# Patient Record
Sex: Male | Born: 1935
Health system: Southern US, Community
[De-identification: ages and names within clinical notes are randomized; demographics above are authoritative.]

## PROBLEM LIST (undated history)

## (undated) DIAGNOSIS — I10 Essential (primary) hypertension: Secondary | ICD-10-CM

## (undated) DIAGNOSIS — M109 Gout, unspecified: Secondary | ICD-10-CM

## (undated) DIAGNOSIS — C61 Malignant neoplasm of prostate: Secondary | ICD-10-CM

## (undated) DIAGNOSIS — E785 Hyperlipidemia, unspecified: Secondary | ICD-10-CM

## (undated) DIAGNOSIS — E079 Disorder of thyroid, unspecified: Secondary | ICD-10-CM

## (undated) HISTORY — PX: COLONOSCOPY: SHX174

## (undated) HISTORY — PX: ESOPHAGOGASTRODUODENOSCOPY: SHX1529

---

## 2012-09-19 ENCOUNTER — Encounter (HOSPITAL_COMMUNITY): Payer: Self-pay | Admitting: *Deleted

## 2012-09-19 ENCOUNTER — Emergency Department (HOSPITAL_COMMUNITY): Payer: Medicare Other

## 2012-09-19 ENCOUNTER — Emergency Department (HOSPITAL_COMMUNITY)
Admission: EM | Admit: 2012-09-19 | Discharge: 2012-09-20 | Disposition: A | Discharge status: Home / Self Care | Payer: Medicare Other | Attending: Emergency Medicine | Admitting: Emergency Medicine

## 2012-09-19 DIAGNOSIS — J3489 Other specified disorders of nose and nasal sinuses: Secondary | ICD-10-CM | POA: Insufficient documentation

## 2012-09-19 DIAGNOSIS — J029 Acute pharyngitis, unspecified: Secondary | ICD-10-CM | POA: Insufficient documentation

## 2012-09-19 DIAGNOSIS — I10 Essential (primary) hypertension: Secondary | ICD-10-CM | POA: Insufficient documentation

## 2012-09-19 DIAGNOSIS — Z79899 Other long term (current) drug therapy: Secondary | ICD-10-CM | POA: Insufficient documentation

## 2012-09-19 DIAGNOSIS — M1990 Unspecified osteoarthritis, unspecified site: Secondary | ICD-10-CM

## 2012-09-19 DIAGNOSIS — M25579 Pain in unspecified ankle and joints of unspecified foot: Secondary | ICD-10-CM | POA: Insufficient documentation

## 2012-09-19 DIAGNOSIS — M7989 Other specified soft tissue disorders: Secondary | ICD-10-CM | POA: Insufficient documentation

## 2012-09-19 DIAGNOSIS — R059 Cough, unspecified: Secondary | ICD-10-CM | POA: Insufficient documentation

## 2012-09-19 DIAGNOSIS — R05 Cough: Secondary | ICD-10-CM | POA: Insufficient documentation

## 2012-09-19 HISTORY — DX: Essential (primary) hypertension: I10

## 2012-09-19 MED ORDER — HYDROCODONE-ACETAMINOPHEN 5-325 MG PO TABS: 1.0000 | ORAL_TABLET | Freq: Four times a day (QID) | ORAL | Status: AC | PRN

## 2012-09-19 MED ORDER — HYDROCODONE-ACETAMINOPHEN 5-325 MG PO TABS
1.0000 | ORAL_TABLET | Freq: Once | ORAL | Status: AC
  Administered 2012-09-19: 1 via ORAL
  Filled 2012-09-19: qty 1

## 2012-09-19 NOTE — ED Provider Notes (Signed)
History   This chart was scribed for Shelda Jakes, MD by Leone Payor, ED Scribe. This patient was seen in room [facility]18[facility] and the patient's care was started at 1955.   CSN: [id number]  Arrival date & time 09/19/12  1804   First MD Initiated Contact with Patient 09/19/12 1955      Chief Complaint  Patient presents with  . Knee Pain  . Ankle Pain     The history is provided by the patient. No language interpreter was used.   Grant Martinez is a 79 y.o. male who presents to the Emergency Department complaining of new, mild, gradual onset right knee, right ankle, and right foot pain starting 1 week ago. Pt states the pain started in the right heel 1 week ago with no redness or swelling. Pt states knee began to hurt 5 days ago and was swollen but has since improved. He reports that his heel and foot currently hurt, but the foot pain is worsened by standing on it. Pt denies any known injury to the area and states it is painful to bear weight on the right leg. Pt reports taking unspecified OTC medication with some relief.     Pt has h/o HTN.  Pt denies smoking and alcohol use.  PCP Dr. Inocencio Homes in [place].  Past Medical History  Diagnosis Date  . Hypertension     History reviewed. No pertinent past surgical history.  History reviewed. No pertinent family history.  History  Substance Use Topics  . Smoking status: Never Smoker   . Smokeless tobacco: Not on file  . Alcohol Use: No      Review of Systems  Constitutional: Negative for fever and chills.  HENT: Positive for congestion, sore throat and rhinorrhea. Negative for neck pain.   Eyes: Negative for visual disturbance.  Respiratory: Positive for cough. Negative for shortness of breath and wheezing.   Cardiovascular: Negative for chest pain.  Gastrointestinal: Negative for nausea, vomiting, abdominal pain and diarrhea.  Genitourinary: Negative for dysuria.  Musculoskeletal: Positive for joint swelling. Negative for back  pain.  Skin: Negative for rash.  Neurological: Negative for headaches.  Hematological: Does not bruise/bleed easily.  All other systems reviewed and are negative.    Allergies  Review of patient's allergies indicates no known allergies.  Home Medications   Current Outpatient Rx  Name  Route  Sig  Dispense  Refill  . HYDROCHLOROTHIAZIDE 25 MG PO TABS   Oral   Take 25 mg by mouth daily.         Marland Kitchen HYDROCODONE-ACETAMINOPHEN 5-325 MG PO TABS   Oral   Take 1-2 tablets by mouth every 6 (six) hours as needed for pain.   10 tablet   0     BP 135/80  Pulse 90  Temp 98.2 F (36.8 C) (Oral)  Resp 16  Ht 5\' 8"  (1.727 m)  Wt 190 lb (86.183 kg)  BMI 28.89 kg/m2  SpO2 95%  Physical Exam  Nursing note and vitals reviewed. Constitutional: He is oriented to person, place, and time. He appears well-developed and well-nourished. No distress.  HENT:  Head: Normocephalic and atraumatic.  Mouth/Throat: Oropharynx is clear and moist.  Eyes: EOM are normal. No scleral icterus.  Neck: Neck supple. No tracheal deviation present.  Cardiovascular: Normal rate, regular rhythm and normal heart sounds.   Pulmonary/Chest: Effort normal and breath sounds normal. No respiratory distress.  Abdominal: Soft. Bowel sounds are normal. There is no tenderness.  Musculoskeletal: Normal range of  motion.       Increased warmth and redness over right knee cap.  Capillary refill in right foot is less than 2 seconds.  Rt ankle, bottom of foot and toes are warm. Swelling and redness around the heal and ankle.  Dorsalis pedis pulse in right foot is 2+.   Lymphadenopathy:    He has no cervical adenopathy.  Neurological: He is alert and oriented to person, place, and time. No cranial nerve deficit. He exhibits normal muscle tone. Coordination normal.       Pt able to move both sets of fingers and toes.   Skin: Skin is warm and dry.  Psychiatric: He has a normal mood and affect. His behavior is normal.     ED Course  Procedures (including critical care time)  DIAGNOSTIC STUDIES: Oxygen Saturation is 95% on room air, adequate by my interpretation.    COORDINATION OF CARE:  8:41 PM Discussed treatment plan which includes x-ray of knee, ankle, and foot with pt at bedside and pt agreed to plan.  Labs Reviewed - No data to display Dg Ankle Complete Right  09/19/2012  *RADIOLOGY REPORT*  Clinical Data: Right ankle pain, no known injury  RIGHT ANKLE - COMPLETE 3+ VIEW  Comparison: None.  Findings: Ankle mortise intact. Mild soft tissue swelling and ankle. Small plantar calcaneal spur. Achilles insertion calcaneal spur formation with dense calcification within the distal Achilles tendon as well. No acute fracture, dislocation or bone destruction.  IMPRESSION: Calcaneal spurring. No acute osseous abnormalities. Calcific tendonitis of the distal Achilles tendon.   Original Report Authenticated By: Ulyses Southward, M.D.    Dg Knee Complete 4 Views Right  09/19/2012  *RADIOLOGY REPORT*  Clinical Data: Right knee pain and swelling, no known injury  RIGHT KNEE - COMPLETE 4+ VIEW  Comparison: None  Findings: Osseous mineralization normal. Minimal medial compartment joint space narrowing. No acute fracture, dislocation or bone destruction. No knee joint effusion. Question mild soft tissue swelling at medial aspect of right knee.  IMPRESSION: Minimal degenerative changes right knee.   Original Report Authenticated By: Ulyses Southward, M.D.    Dg Foot Complete Right  09/19/2012  *RADIOLOGY REPORT*  Clinical Data: Right foot pain, no known injury  RIGHT FOOT COMPLETE - 3+ VIEW  Comparison: None  Findings: Osseous mineralization normal. Joint spaces preserved. Plantar and Achilles insertion calcaneal spur formation. Calcification within the distal Achilles tendon. No acute fracture, dislocation, or bone destruction.  IMPRESSION: Calcaneal spurring. Calcific tendonitis of the distal Achilles tendon. No acute osseous  abnormalities.   Original Report Authenticated By: Ulyses Southward, M.D.      1. Arthritis       MDM  Symptoms consistent with arthritis may be gouty arthritis. X-rays reveal no bony injuries. Patient is nontoxic no acute distress. Will treat with pain medication.       I personally performed the services described in this documentation, which was scribed in my presence. The recorded information has been reviewed and is accurate.     Shelda Jakes, MD 09/19/12 2256

## 2012-09-19 NOTE — ED Notes (Addendum)
Pt states right knee pain, ankle pain and foot pain. States knee was swollen but is much better than it was. Pt states it hurts to bear weight to leg. No known injury

## 2013-04-07 ENCOUNTER — Other Ambulatory Visit (HOSPITAL_COMMUNITY): Payer: Self-pay | Admitting: Urology

## 2013-04-07 DIAGNOSIS — C61 Malignant neoplasm of prostate: Secondary | ICD-10-CM

## 2013-06-15 ENCOUNTER — Other Ambulatory Visit (HOSPITAL_COMMUNITY): Payer: Medicare Other

## 2013-06-15 ENCOUNTER — Ambulatory Visit (HOSPITAL_COMMUNITY)
Admission: RE | Admit: 2013-06-15 | Discharge: 2013-06-15 | Disposition: A | Discharge status: Home / Self Care | Payer: Medicare Other | Source: Ambulatory Visit | Attending: Urology | Admitting: Urology

## 2013-06-15 DIAGNOSIS — K4020 Bilateral inguinal hernia, without obstruction or gangrene, not specified as recurrent: Secondary | ICD-10-CM | POA: Insufficient documentation

## 2013-06-15 DIAGNOSIS — I10 Essential (primary) hypertension: Secondary | ICD-10-CM | POA: Insufficient documentation

## 2013-06-15 DIAGNOSIS — N400 Enlarged prostate without lower urinary tract symptoms: Secondary | ICD-10-CM | POA: Insufficient documentation

## 2013-06-15 DIAGNOSIS — N501 Vascular disorders of male genital organs: Secondary | ICD-10-CM | POA: Insufficient documentation

## 2013-06-15 DIAGNOSIS — C61 Malignant neoplasm of prostate: Secondary | ICD-10-CM

## 2013-06-15 LAB — CREATININE, SERUM
GFR calc Af Amer: 61 mL/min — ABNORMAL LOW (ref >90)
GFR calc non Af Amer: 52 mL/min — ABNORMAL LOW (ref >90)

## 2013-06-15 MED ORDER — GADOBENATE DIMEGLUMINE 529 MG/ML IV SOLN
18.0000 mL | Freq: Once | INTRAVENOUS | Status: AC | PRN
  Administered 2013-06-15: 18 mL via INTRAVENOUS

## 2013-07-13 ENCOUNTER — Other Ambulatory Visit (HOSPITAL_COMMUNITY): Payer: Self-pay | Admitting: Urology

## 2013-07-13 DIAGNOSIS — C61 Malignant neoplasm of prostate: Secondary | ICD-10-CM

## 2013-07-28 ENCOUNTER — Encounter (HOSPITAL_COMMUNITY): Payer: Medicare Other

## 2014-11-03 DIAGNOSIS — E039 Hypothyroidism, unspecified: Secondary | ICD-10-CM | POA: Diagnosis not present

## 2014-11-03 DIAGNOSIS — E782 Mixed hyperlipidemia: Secondary | ICD-10-CM | POA: Diagnosis not present

## 2014-11-03 DIAGNOSIS — I10 Essential (primary) hypertension: Secondary | ICD-10-CM | POA: Diagnosis not present

## 2014-11-08 DIAGNOSIS — E039 Hypothyroidism, unspecified: Secondary | ICD-10-CM | POA: Diagnosis not present

## 2014-11-08 DIAGNOSIS — I10 Essential (primary) hypertension: Secondary | ICD-10-CM | POA: Diagnosis not present

## 2014-11-08 DIAGNOSIS — Z23 Encounter for immunization: Secondary | ICD-10-CM | POA: Diagnosis not present

## 2014-11-08 DIAGNOSIS — Z0001 Encounter for general adult medical examination with abnormal findings: Secondary | ICD-10-CM | POA: Diagnosis not present

## 2014-11-08 DIAGNOSIS — C61 Malignant neoplasm of prostate: Secondary | ICD-10-CM | POA: Diagnosis not present

## 2014-11-08 DIAGNOSIS — M1000 Idiopathic gout, unspecified site: Secondary | ICD-10-CM | POA: Diagnosis not present

## 2014-11-08 DIAGNOSIS — H6191 Disorder of right external ear, unspecified: Secondary | ICD-10-CM | POA: Diagnosis not present

## 2014-11-08 DIAGNOSIS — E782 Mixed hyperlipidemia: Secondary | ICD-10-CM | POA: Diagnosis not present

## 2014-12-12 DIAGNOSIS — C61 Malignant neoplasm of prostate: Secondary | ICD-10-CM | POA: Diagnosis not present

## 2014-12-21 DIAGNOSIS — K1121 Acute sialoadenitis: Secondary | ICD-10-CM | POA: Diagnosis not present

## 2014-12-23 DIAGNOSIS — K1121 Acute sialoadenitis: Secondary | ICD-10-CM | POA: Diagnosis not present

## 2014-12-27 DIAGNOSIS — R195 Other fecal abnormalities: Secondary | ICD-10-CM | POA: Diagnosis not present

## 2014-12-27 DIAGNOSIS — C61 Malignant neoplasm of prostate: Secondary | ICD-10-CM | POA: Diagnosis not present

## 2015-01-31 DIAGNOSIS — R194 Change in bowel habit: Secondary | ICD-10-CM | POA: Diagnosis not present

## 2015-01-31 DIAGNOSIS — Z86010 Personal history of colonic polyps: Secondary | ICD-10-CM | POA: Diagnosis not present

## 2015-02-10 DIAGNOSIS — Z79899 Other long term (current) drug therapy: Secondary | ICD-10-CM | POA: Diagnosis not present

## 2015-02-10 DIAGNOSIS — K627 Radiation proctitis: Secondary | ICD-10-CM | POA: Diagnosis not present

## 2015-02-10 DIAGNOSIS — D125 Benign neoplasm of sigmoid colon: Secondary | ICD-10-CM | POA: Diagnosis not present

## 2015-02-10 DIAGNOSIS — Z1211 Encounter for screening for malignant neoplasm of colon: Secondary | ICD-10-CM | POA: Diagnosis not present

## 2015-02-10 DIAGNOSIS — R194 Change in bowel habit: Secondary | ICD-10-CM | POA: Diagnosis not present

## 2015-02-10 DIAGNOSIS — Z86010 Personal history of colonic polyps: Secondary | ICD-10-CM | POA: Diagnosis not present

## 2015-02-10 DIAGNOSIS — K5730 Diverticulosis of large intestine without perforation or abscess without bleeding: Secondary | ICD-10-CM | POA: Diagnosis not present

## 2015-02-10 DIAGNOSIS — Z8546 Personal history of malignant neoplasm of prostate: Secondary | ICD-10-CM | POA: Diagnosis not present

## 2015-02-10 DIAGNOSIS — K633 Ulcer of intestine: Secondary | ICD-10-CM | POA: Diagnosis not present

## 2015-02-10 DIAGNOSIS — Z923 Personal history of irradiation: Secondary | ICD-10-CM | POA: Diagnosis not present

## 2015-02-15 DIAGNOSIS — K633 Ulcer of intestine: Secondary | ICD-10-CM | POA: Diagnosis not present

## 2015-04-03 DIAGNOSIS — A084 Viral intestinal infection, unspecified: Secondary | ICD-10-CM | POA: Diagnosis not present

## 2015-05-09 DIAGNOSIS — E039 Hypothyroidism, unspecified: Secondary | ICD-10-CM | POA: Diagnosis not present

## 2015-05-09 DIAGNOSIS — M1000 Idiopathic gout, unspecified site: Secondary | ICD-10-CM | POA: Diagnosis not present

## 2015-05-09 DIAGNOSIS — I10 Essential (primary) hypertension: Secondary | ICD-10-CM | POA: Diagnosis not present

## 2015-05-09 DIAGNOSIS — E782 Mixed hyperlipidemia: Secondary | ICD-10-CM | POA: Diagnosis not present

## 2015-05-16 DIAGNOSIS — M1000 Idiopathic gout, unspecified site: Secondary | ICD-10-CM | POA: Diagnosis not present

## 2015-05-16 DIAGNOSIS — E782 Mixed hyperlipidemia: Secondary | ICD-10-CM | POA: Diagnosis not present

## 2015-05-16 DIAGNOSIS — C61 Malignant neoplasm of prostate: Secondary | ICD-10-CM | POA: Diagnosis not present

## 2015-05-16 DIAGNOSIS — I10 Essential (primary) hypertension: Secondary | ICD-10-CM | POA: Diagnosis not present

## 2015-05-16 DIAGNOSIS — E039 Hypothyroidism, unspecified: Secondary | ICD-10-CM | POA: Diagnosis not present

## 2015-06-21 DIAGNOSIS — Z23 Encounter for immunization: Secondary | ICD-10-CM | POA: Diagnosis not present

## 2015-07-04 DIAGNOSIS — C61 Malignant neoplasm of prostate: Secondary | ICD-10-CM | POA: Diagnosis not present

## 2015-07-11 DIAGNOSIS — C61 Malignant neoplasm of prostate: Secondary | ICD-10-CM | POA: Diagnosis not present

## 2015-11-08 DIAGNOSIS — C61 Malignant neoplasm of prostate: Secondary | ICD-10-CM | POA: Diagnosis not present

## 2015-11-08 DIAGNOSIS — I10 Essential (primary) hypertension: Secondary | ICD-10-CM | POA: Diagnosis not present

## 2015-11-08 DIAGNOSIS — E559 Vitamin D deficiency, unspecified: Secondary | ICD-10-CM | POA: Diagnosis not present

## 2015-11-08 DIAGNOSIS — M1000 Idiopathic gout, unspecified site: Secondary | ICD-10-CM | POA: Diagnosis not present

## 2015-11-08 DIAGNOSIS — E782 Mixed hyperlipidemia: Secondary | ICD-10-CM | POA: Diagnosis not present

## 2015-11-08 DIAGNOSIS — E039 Hypothyroidism, unspecified: Secondary | ICD-10-CM | POA: Diagnosis not present

## 2015-11-15 DIAGNOSIS — Z0001 Encounter for general adult medical examination with abnormal findings: Secondary | ICD-10-CM | POA: Diagnosis not present

## 2015-11-15 DIAGNOSIS — E559 Vitamin D deficiency, unspecified: Secondary | ICD-10-CM | POA: Diagnosis not present

## 2015-11-15 DIAGNOSIS — C61 Malignant neoplasm of prostate: Secondary | ICD-10-CM | POA: Diagnosis not present

## 2015-11-15 DIAGNOSIS — E782 Mixed hyperlipidemia: Secondary | ICD-10-CM | POA: Diagnosis not present

## 2015-11-15 DIAGNOSIS — I10 Essential (primary) hypertension: Secondary | ICD-10-CM | POA: Diagnosis not present

## 2015-11-15 DIAGNOSIS — K635 Polyp of colon: Secondary | ICD-10-CM | POA: Diagnosis not present

## 2015-11-15 DIAGNOSIS — E039 Hypothyroidism, unspecified: Secondary | ICD-10-CM | POA: Diagnosis not present

## 2015-11-15 DIAGNOSIS — K5730 Diverticulosis of large intestine without perforation or abscess without bleeding: Secondary | ICD-10-CM | POA: Diagnosis not present

## 2015-11-15 DIAGNOSIS — M1000 Idiopathic gout, unspecified site: Secondary | ICD-10-CM | POA: Diagnosis not present

## 2015-12-11 DIAGNOSIS — H35031 Hypertensive retinopathy, right eye: Secondary | ICD-10-CM | POA: Diagnosis not present

## 2015-12-11 DIAGNOSIS — H25042 Posterior subcapsular polar age-related cataract, left eye: Secondary | ICD-10-CM | POA: Diagnosis not present

## 2015-12-11 DIAGNOSIS — H2512 Age-related nuclear cataract, left eye: Secondary | ICD-10-CM | POA: Diagnosis not present

## 2015-12-11 DIAGNOSIS — H35033 Hypertensive retinopathy, bilateral: Secondary | ICD-10-CM | POA: Diagnosis not present

## 2015-12-11 DIAGNOSIS — H2513 Age-related nuclear cataract, bilateral: Secondary | ICD-10-CM | POA: Diagnosis not present

## 2015-12-11 DIAGNOSIS — H25013 Cortical age-related cataract, bilateral: Secondary | ICD-10-CM | POA: Diagnosis not present

## 2015-12-11 DIAGNOSIS — H35032 Hypertensive retinopathy, left eye: Secondary | ICD-10-CM | POA: Diagnosis not present

## 2015-12-11 DIAGNOSIS — H25012 Cortical age-related cataract, left eye: Secondary | ICD-10-CM | POA: Diagnosis not present

## 2016-01-01 DIAGNOSIS — C61 Malignant neoplasm of prostate: Secondary | ICD-10-CM | POA: Diagnosis not present

## 2016-01-08 DIAGNOSIS — C61 Malignant neoplasm of prostate: Secondary | ICD-10-CM | POA: Diagnosis not present

## 2016-01-16 DIAGNOSIS — H2512 Age-related nuclear cataract, left eye: Secondary | ICD-10-CM | POA: Diagnosis not present

## 2016-02-05 DIAGNOSIS — H25011 Cortical age-related cataract, right eye: Secondary | ICD-10-CM | POA: Diagnosis not present

## 2016-02-05 DIAGNOSIS — H2511 Age-related nuclear cataract, right eye: Secondary | ICD-10-CM | POA: Diagnosis not present

## 2016-02-13 DIAGNOSIS — H2511 Age-related nuclear cataract, right eye: Secondary | ICD-10-CM | POA: Diagnosis not present

## 2016-05-24 DIAGNOSIS — K5730 Diverticulosis of large intestine without perforation or abscess without bleeding: Secondary | ICD-10-CM | POA: Diagnosis not present

## 2016-05-24 DIAGNOSIS — M1000 Idiopathic gout, unspecified site: Secondary | ICD-10-CM | POA: Diagnosis not present

## 2016-05-24 DIAGNOSIS — C61 Malignant neoplasm of prostate: Secondary | ICD-10-CM | POA: Diagnosis not present

## 2016-05-24 DIAGNOSIS — K635 Polyp of colon: Secondary | ICD-10-CM | POA: Diagnosis not present

## 2016-05-24 DIAGNOSIS — E039 Hypothyroidism, unspecified: Secondary | ICD-10-CM | POA: Diagnosis not present

## 2016-05-24 DIAGNOSIS — I10 Essential (primary) hypertension: Secondary | ICD-10-CM | POA: Diagnosis not present

## 2016-05-24 DIAGNOSIS — E782 Mixed hyperlipidemia: Secondary | ICD-10-CM | POA: Diagnosis not present

## 2016-05-24 DIAGNOSIS — E559 Vitamin D deficiency, unspecified: Secondary | ICD-10-CM | POA: Diagnosis not present

## 2016-05-27 DIAGNOSIS — K5730 Diverticulosis of large intestine without perforation or abscess without bleeding: Secondary | ICD-10-CM | POA: Diagnosis not present

## 2016-05-27 DIAGNOSIS — E039 Hypothyroidism, unspecified: Secondary | ICD-10-CM | POA: Diagnosis not present

## 2016-05-27 DIAGNOSIS — Z6828 Body mass index (BMI) 28.0-28.9, adult: Secondary | ICD-10-CM | POA: Diagnosis not present

## 2016-05-27 DIAGNOSIS — E782 Mixed hyperlipidemia: Secondary | ICD-10-CM | POA: Diagnosis not present

## 2016-05-27 DIAGNOSIS — C61 Malignant neoplasm of prostate: Secondary | ICD-10-CM | POA: Diagnosis not present

## 2016-05-27 DIAGNOSIS — K635 Polyp of colon: Secondary | ICD-10-CM | POA: Diagnosis not present

## 2016-05-27 DIAGNOSIS — I10 Essential (primary) hypertension: Secondary | ICD-10-CM | POA: Diagnosis not present

## 2016-06-25 DIAGNOSIS — C61 Malignant neoplasm of prostate: Secondary | ICD-10-CM | POA: Diagnosis not present

## 2016-07-01 DIAGNOSIS — Z23 Encounter for immunization: Secondary | ICD-10-CM | POA: Diagnosis not present

## 2016-07-02 DIAGNOSIS — C61 Malignant neoplasm of prostate: Secondary | ICD-10-CM | POA: Diagnosis not present

## 2016-11-22 DIAGNOSIS — M1000 Idiopathic gout, unspecified site: Secondary | ICD-10-CM | POA: Diagnosis not present

## 2016-11-22 DIAGNOSIS — E559 Vitamin D deficiency, unspecified: Secondary | ICD-10-CM | POA: Diagnosis not present

## 2016-11-22 DIAGNOSIS — K5730 Diverticulosis of large intestine without perforation or abscess without bleeding: Secondary | ICD-10-CM | POA: Diagnosis not present

## 2016-11-22 DIAGNOSIS — E039 Hypothyroidism, unspecified: Secondary | ICD-10-CM | POA: Diagnosis not present

## 2016-11-22 DIAGNOSIS — I10 Essential (primary) hypertension: Secondary | ICD-10-CM | POA: Diagnosis not present

## 2016-11-22 DIAGNOSIS — K635 Polyp of colon: Secondary | ICD-10-CM | POA: Diagnosis not present

## 2016-11-22 DIAGNOSIS — C61 Malignant neoplasm of prostate: Secondary | ICD-10-CM | POA: Diagnosis not present

## 2016-11-22 DIAGNOSIS — E782 Mixed hyperlipidemia: Secondary | ICD-10-CM | POA: Diagnosis not present

## 2016-11-26 DIAGNOSIS — C61 Malignant neoplasm of prostate: Secondary | ICD-10-CM | POA: Diagnosis not present

## 2016-11-26 DIAGNOSIS — I10 Essential (primary) hypertension: Secondary | ICD-10-CM | POA: Diagnosis not present

## 2016-11-26 DIAGNOSIS — E039 Hypothyroidism, unspecified: Secondary | ICD-10-CM | POA: Diagnosis not present

## 2016-11-26 DIAGNOSIS — E782 Mixed hyperlipidemia: Secondary | ICD-10-CM | POA: Diagnosis not present

## 2016-11-26 DIAGNOSIS — K5730 Diverticulosis of large intestine without perforation or abscess without bleeding: Secondary | ICD-10-CM | POA: Diagnosis not present

## 2016-11-26 DIAGNOSIS — K635 Polyp of colon: Secondary | ICD-10-CM | POA: Diagnosis not present

## 2016-11-26 DIAGNOSIS — Z0001 Encounter for general adult medical examination with abnormal findings: Secondary | ICD-10-CM | POA: Diagnosis not present

## 2017-02-25 DIAGNOSIS — C61 Malignant neoplasm of prostate: Secondary | ICD-10-CM | POA: Diagnosis not present

## 2017-05-26 DIAGNOSIS — E039 Hypothyroidism, unspecified: Secondary | ICD-10-CM | POA: Diagnosis not present

## 2017-05-26 DIAGNOSIS — E559 Vitamin D deficiency, unspecified: Secondary | ICD-10-CM | POA: Diagnosis not present

## 2017-05-26 DIAGNOSIS — N400 Enlarged prostate without lower urinary tract symptoms: Secondary | ICD-10-CM | POA: Diagnosis not present

## 2017-05-26 DIAGNOSIS — E782 Mixed hyperlipidemia: Secondary | ICD-10-CM | POA: Diagnosis not present

## 2017-05-26 DIAGNOSIS — I10 Essential (primary) hypertension: Secondary | ICD-10-CM | POA: Diagnosis not present

## 2017-05-28 DIAGNOSIS — C61 Malignant neoplasm of prostate: Secondary | ICD-10-CM | POA: Diagnosis not present

## 2017-05-28 DIAGNOSIS — E039 Hypothyroidism, unspecified: Secondary | ICD-10-CM | POA: Diagnosis not present

## 2017-05-28 DIAGNOSIS — Z6829 Body mass index (BMI) 29.0-29.9, adult: Secondary | ICD-10-CM | POA: Diagnosis not present

## 2017-05-28 DIAGNOSIS — I10 Essential (primary) hypertension: Secondary | ICD-10-CM | POA: Diagnosis not present

## 2017-05-28 DIAGNOSIS — N400 Enlarged prostate without lower urinary tract symptoms: Secondary | ICD-10-CM | POA: Diagnosis not present

## 2017-05-28 DIAGNOSIS — Z23 Encounter for immunization: Secondary | ICD-10-CM | POA: Diagnosis not present

## 2017-05-28 DIAGNOSIS — K5730 Diverticulosis of large intestine without perforation or abscess without bleeding: Secondary | ICD-10-CM | POA: Diagnosis not present

## 2017-05-28 DIAGNOSIS — E782 Mixed hyperlipidemia: Secondary | ICD-10-CM | POA: Diagnosis not present

## 2017-08-27 DIAGNOSIS — C61 Malignant neoplasm of prostate: Secondary | ICD-10-CM | POA: Diagnosis not present

## 2017-10-06 DIAGNOSIS — Z8546 Personal history of malignant neoplasm of prostate: Secondary | ICD-10-CM | POA: Diagnosis not present

## 2017-12-01 DIAGNOSIS — E559 Vitamin D deficiency, unspecified: Secondary | ICD-10-CM | POA: Diagnosis not present

## 2017-12-01 DIAGNOSIS — E039 Hypothyroidism, unspecified: Secondary | ICD-10-CM | POA: Diagnosis not present

## 2017-12-01 DIAGNOSIS — I10 Essential (primary) hypertension: Secondary | ICD-10-CM | POA: Diagnosis not present

## 2017-12-01 DIAGNOSIS — E782 Mixed hyperlipidemia: Secondary | ICD-10-CM | POA: Diagnosis not present

## 2017-12-01 DIAGNOSIS — R972 Elevated prostate specific antigen [PSA]: Secondary | ICD-10-CM | POA: Diagnosis not present

## 2017-12-05 DIAGNOSIS — C61 Malignant neoplasm of prostate: Secondary | ICD-10-CM | POA: Diagnosis not present

## 2017-12-05 DIAGNOSIS — Z0001 Encounter for general adult medical examination with abnormal findings: Secondary | ICD-10-CM | POA: Diagnosis not present

## 2017-12-05 DIAGNOSIS — I10 Essential (primary) hypertension: Secondary | ICD-10-CM | POA: Diagnosis not present

## 2017-12-05 DIAGNOSIS — E559 Vitamin D deficiency, unspecified: Secondary | ICD-10-CM | POA: Diagnosis not present

## 2017-12-05 DIAGNOSIS — E039 Hypothyroidism, unspecified: Secondary | ICD-10-CM | POA: Diagnosis not present

## 2017-12-05 DIAGNOSIS — E782 Mixed hyperlipidemia: Secondary | ICD-10-CM | POA: Diagnosis not present

## 2017-12-05 DIAGNOSIS — M1000 Idiopathic gout, unspecified site: Secondary | ICD-10-CM | POA: Diagnosis not present

## 2017-12-05 DIAGNOSIS — Z6829 Body mass index (BMI) 29.0-29.9, adult: Secondary | ICD-10-CM | POA: Diagnosis not present

## 2018-01-05 DIAGNOSIS — Z8546 Personal history of malignant neoplasm of prostate: Secondary | ICD-10-CM | POA: Diagnosis not present

## 2018-03-30 DIAGNOSIS — Z8546 Personal history of malignant neoplasm of prostate: Secondary | ICD-10-CM | POA: Diagnosis not present

## 2018-04-06 DIAGNOSIS — Z8546 Personal history of malignant neoplasm of prostate: Secondary | ICD-10-CM | POA: Diagnosis not present

## 2018-04-06 DIAGNOSIS — R3915 Urgency of urination: Secondary | ICD-10-CM | POA: Diagnosis not present

## 2018-06-08 DIAGNOSIS — E782 Mixed hyperlipidemia: Secondary | ICD-10-CM | POA: Diagnosis not present

## 2018-06-08 DIAGNOSIS — I10 Essential (primary) hypertension: Secondary | ICD-10-CM | POA: Diagnosis not present

## 2018-06-08 DIAGNOSIS — E559 Vitamin D deficiency, unspecified: Secondary | ICD-10-CM | POA: Diagnosis not present

## 2018-06-08 DIAGNOSIS — N410 Acute prostatitis: Secondary | ICD-10-CM | POA: Diagnosis not present

## 2018-06-08 DIAGNOSIS — E039 Hypothyroidism, unspecified: Secondary | ICD-10-CM | POA: Diagnosis not present

## 2018-06-09 DIAGNOSIS — C61 Malignant neoplasm of prostate: Secondary | ICD-10-CM | POA: Diagnosis not present

## 2018-06-09 DIAGNOSIS — N400 Enlarged prostate without lower urinary tract symptoms: Secondary | ICD-10-CM | POA: Diagnosis not present

## 2018-06-09 DIAGNOSIS — E039 Hypothyroidism, unspecified: Secondary | ICD-10-CM | POA: Diagnosis not present

## 2018-06-09 DIAGNOSIS — I10 Essential (primary) hypertension: Secondary | ICD-10-CM | POA: Diagnosis not present

## 2018-06-09 DIAGNOSIS — E782 Mixed hyperlipidemia: Secondary | ICD-10-CM | POA: Diagnosis not present

## 2018-06-09 DIAGNOSIS — M1000 Idiopathic gout, unspecified site: Secondary | ICD-10-CM | POA: Diagnosis not present

## 2018-06-09 DIAGNOSIS — Z6828 Body mass index (BMI) 28.0-28.9, adult: Secondary | ICD-10-CM | POA: Diagnosis not present

## 2018-06-09 DIAGNOSIS — N182 Chronic kidney disease, stage 2 (mild): Secondary | ICD-10-CM | POA: Diagnosis not present

## 2018-07-08 DIAGNOSIS — Z23 Encounter for immunization: Secondary | ICD-10-CM | POA: Diagnosis not present

## 2018-09-21 DIAGNOSIS — Z8546 Personal history of malignant neoplasm of prostate: Secondary | ICD-10-CM | POA: Diagnosis not present

## 2018-09-25 DIAGNOSIS — R3915 Urgency of urination: Secondary | ICD-10-CM | POA: Diagnosis not present

## 2018-09-25 DIAGNOSIS — Z8546 Personal history of malignant neoplasm of prostate: Secondary | ICD-10-CM | POA: Diagnosis not present

## 2018-12-14 DIAGNOSIS — E039 Hypothyroidism, unspecified: Secondary | ICD-10-CM | POA: Diagnosis not present

## 2018-12-14 DIAGNOSIS — E782 Mixed hyperlipidemia: Secondary | ICD-10-CM | POA: Diagnosis not present

## 2018-12-14 DIAGNOSIS — I10 Essential (primary) hypertension: Secondary | ICD-10-CM | POA: Diagnosis not present

## 2018-12-14 DIAGNOSIS — N182 Chronic kidney disease, stage 2 (mild): Secondary | ICD-10-CM | POA: Diagnosis not present

## 2018-12-14 DIAGNOSIS — R972 Elevated prostate specific antigen [PSA]: Secondary | ICD-10-CM | POA: Diagnosis not present

## 2018-12-14 DIAGNOSIS — R739 Hyperglycemia, unspecified: Secondary | ICD-10-CM | POA: Diagnosis not present

## 2018-12-17 DIAGNOSIS — N400 Enlarged prostate without lower urinary tract symptoms: Secondary | ICD-10-CM | POA: Diagnosis not present

## 2018-12-17 DIAGNOSIS — M1000 Idiopathic gout, unspecified site: Secondary | ICD-10-CM | POA: Diagnosis not present

## 2018-12-17 DIAGNOSIS — Z6828 Body mass index (BMI) 28.0-28.9, adult: Secondary | ICD-10-CM | POA: Diagnosis not present

## 2018-12-17 DIAGNOSIS — E039 Hypothyroidism, unspecified: Secondary | ICD-10-CM | POA: Diagnosis not present

## 2018-12-17 DIAGNOSIS — N182 Chronic kidney disease, stage 2 (mild): Secondary | ICD-10-CM | POA: Diagnosis not present

## 2018-12-17 DIAGNOSIS — I10 Essential (primary) hypertension: Secondary | ICD-10-CM | POA: Diagnosis not present

## 2018-12-17 DIAGNOSIS — E782 Mixed hyperlipidemia: Secondary | ICD-10-CM | POA: Diagnosis not present

## 2018-12-17 DIAGNOSIS — C61 Malignant neoplasm of prostate: Secondary | ICD-10-CM | POA: Diagnosis not present

## 2018-12-17 DIAGNOSIS — Z0001 Encounter for general adult medical examination with abnormal findings: Secondary | ICD-10-CM | POA: Diagnosis not present

## 2019-03-25 DIAGNOSIS — Z8546 Personal history of malignant neoplasm of prostate: Secondary | ICD-10-CM | POA: Diagnosis not present

## 2019-04-01 DIAGNOSIS — R350 Frequency of micturition: Secondary | ICD-10-CM | POA: Diagnosis not present

## 2019-06-15 DIAGNOSIS — R739 Hyperglycemia, unspecified: Secondary | ICD-10-CM | POA: Diagnosis not present

## 2019-06-15 DIAGNOSIS — I10 Essential (primary) hypertension: Secondary | ICD-10-CM | POA: Diagnosis not present

## 2019-06-15 DIAGNOSIS — N182 Chronic kidney disease, stage 2 (mild): Secondary | ICD-10-CM | POA: Diagnosis not present

## 2019-06-15 DIAGNOSIS — E039 Hypothyroidism, unspecified: Secondary | ICD-10-CM | POA: Diagnosis not present

## 2019-06-15 DIAGNOSIS — E782 Mixed hyperlipidemia: Secondary | ICD-10-CM | POA: Diagnosis not present

## 2019-06-22 DIAGNOSIS — I10 Essential (primary) hypertension: Secondary | ICD-10-CM | POA: Diagnosis not present

## 2019-06-22 DIAGNOSIS — Z0001 Encounter for general adult medical examination with abnormal findings: Secondary | ICD-10-CM | POA: Diagnosis not present

## 2019-06-22 DIAGNOSIS — N182 Chronic kidney disease, stage 2 (mild): Secondary | ICD-10-CM | POA: Diagnosis not present

## 2019-06-22 DIAGNOSIS — R7303 Prediabetes: Secondary | ICD-10-CM | POA: Diagnosis not present

## 2019-06-22 DIAGNOSIS — C61 Malignant neoplasm of prostate: Secondary | ICD-10-CM | POA: Diagnosis not present

## 2019-06-22 DIAGNOSIS — Z23 Encounter for immunization: Secondary | ICD-10-CM | POA: Diagnosis not present

## 2019-06-22 DIAGNOSIS — E039 Hypothyroidism, unspecified: Secondary | ICD-10-CM | POA: Diagnosis not present

## 2019-06-22 DIAGNOSIS — Z6828 Body mass index (BMI) 28.0-28.9, adult: Secondary | ICD-10-CM | POA: Diagnosis not present

## 2019-07-12 DIAGNOSIS — R7303 Prediabetes: Secondary | ICD-10-CM | POA: Diagnosis not present

## 2019-07-12 DIAGNOSIS — Z6827 Body mass index (BMI) 27.0-27.9, adult: Secondary | ICD-10-CM | POA: Diagnosis not present

## 2019-07-12 DIAGNOSIS — E039 Hypothyroidism, unspecified: Secondary | ICD-10-CM | POA: Diagnosis not present

## 2019-07-12 DIAGNOSIS — I10 Essential (primary) hypertension: Secondary | ICD-10-CM | POA: Diagnosis not present

## 2019-07-12 DIAGNOSIS — N182 Chronic kidney disease, stage 2 (mild): Secondary | ICD-10-CM | POA: Diagnosis not present

## 2019-07-12 DIAGNOSIS — E782 Mixed hyperlipidemia: Secondary | ICD-10-CM | POA: Diagnosis not present

## 2019-07-12 DIAGNOSIS — R42 Dizziness and giddiness: Secondary | ICD-10-CM | POA: Diagnosis not present

## 2019-07-16 ENCOUNTER — Other Ambulatory Visit: Payer: Self-pay

## 2019-07-16 ENCOUNTER — Encounter (HOSPITAL_COMMUNITY): Payer: Self-pay

## 2019-07-16 ENCOUNTER — Emergency Department (HOSPITAL_COMMUNITY): Payer: Medicare HMO

## 2019-07-16 ENCOUNTER — Emergency Department (HOSPITAL_COMMUNITY)
Admission: EM | Admit: 2019-07-16 | Discharge: 2019-07-17 | Disposition: A | Discharge status: Home / Self Care | Payer: Medicare HMO | Attending: Emergency Medicine | Admitting: Emergency Medicine

## 2019-07-16 DIAGNOSIS — E782 Mixed hyperlipidemia: Secondary | ICD-10-CM | POA: Diagnosis not present

## 2019-07-16 DIAGNOSIS — Z20822 Contact with and (suspected) exposure to covid-19: Secondary | ICD-10-CM

## 2019-07-16 DIAGNOSIS — E860 Dehydration: Secondary | ICD-10-CM | POA: Insufficient documentation

## 2019-07-16 DIAGNOSIS — I10 Essential (primary) hypertension: Secondary | ICD-10-CM | POA: Diagnosis not present

## 2019-07-16 DIAGNOSIS — R0602 Shortness of breath: Secondary | ICD-10-CM | POA: Diagnosis not present

## 2019-07-16 DIAGNOSIS — R531 Weakness: Secondary | ICD-10-CM | POA: Diagnosis not present

## 2019-07-16 DIAGNOSIS — Z20828 Contact with and (suspected) exposure to other viral communicable diseases: Secondary | ICD-10-CM | POA: Diagnosis not present

## 2019-07-16 DIAGNOSIS — E876 Hypokalemia: Secondary | ICD-10-CM | POA: Insufficient documentation

## 2019-07-16 DIAGNOSIS — N182 Chronic kidney disease, stage 2 (mild): Secondary | ICD-10-CM | POA: Diagnosis not present

## 2019-07-16 DIAGNOSIS — R42 Dizziness and giddiness: Secondary | ICD-10-CM | POA: Diagnosis not present

## 2019-07-16 DIAGNOSIS — R7303 Prediabetes: Secondary | ICD-10-CM | POA: Diagnosis not present

## 2019-07-16 DIAGNOSIS — E039 Hypothyroidism, unspecified: Secondary | ICD-10-CM | POA: Diagnosis not present

## 2019-07-16 HISTORY — DX: Disorder of thyroid, unspecified: E07.9

## 2019-07-16 LAB — BASIC METABOLIC PANEL
Anion gap: 13 (ref 5–15)
BUN: 18 mg/dL (ref 8–23)
CO2: 29 mmol/L (ref 22–32)
Calcium: 8.4 mg/dL — ABNORMAL LOW (ref 8.9–10.3)
Chloride: 93 mmol/L — ABNORMAL LOW (ref 98–111)
Creatinine, Ser: 1.38 mg/dL — ABNORMAL HIGH (ref 0.61–1.24)
GFR calc Af Amer: 54 mL/min — ABNORMAL LOW (ref >60)
GFR calc non Af Amer: 47 mL/min — ABNORMAL LOW (ref >60)
Glucose, Bld: 101 mg/dL — ABNORMAL HIGH (ref 70–99)
Potassium: 3 mmol/L — ABNORMAL LOW (ref 3.5–5.1)
Sodium: 135 mmol/L (ref 135–145)

## 2019-07-16 LAB — CBC
HCT: 45.6 % (ref 39.0–52.0)
Hemoglobin: 15.3 g/dL (ref 13.0–17.0)
MCH: 30.7 pg (ref 26.0–34.0)
MCHC: 33.6 g/dL (ref 30.0–36.0)
MCV: 91.6 fL (ref 80.0–100.0)
Platelets: 163 10*3/uL (ref 150–400)
RBC: 4.98 MIL/uL (ref 4.22–5.81)
RDW: 13.7 % (ref 11.5–15.5)
WBC: 3.7 10*3/uL — ABNORMAL LOW (ref 4.0–10.5)
nRBC: 0 % (ref 0.0–0.2)

## 2019-07-16 LAB — CBG MONITORING, ED: Glucose-Capillary: 93 mg/dL (ref 70–99)

## 2019-07-16 MED ORDER — SODIUM CHLORIDE 0.9 % IV BOLUS
1000.0000 mL | Freq: Once | INTRAVENOUS | Status: AC
  Administered 2019-07-16: 1000 mL via INTRAVENOUS

## 2019-07-16 NOTE — ED Notes (Signed)
Pt states he does not need to urinate at this time, aware of DO, urinal at bedside, call light w/in reach

## 2019-07-16 NOTE — ED Triage Notes (Signed)
Pt presents to ED with complaints of generalized weakness, dizziness, non productive cough x 2 weeks. Pt denies fever. Pt tested for Covid today.

## 2019-07-16 NOTE — ED Provider Notes (Signed)
[facility] EMERGENCY DEPARTMENT Provider Note   CSN: [id number] Arrival date & time: 07/16/19  1724     History   Chief Complaint Chief Complaint  Patient presents with  . Weakness    HPI Grant Martinez is a 85 y.o. male.     HPI Patient presents with his niece who assists with the HPI. They present at the request of the patient's physician due to weakness. Patient states that he is generally well, has a history only of hypertension, and thyroid disease, as well as poor hearing. Patient lives with his brother. Notably, the patient's brother is coronavirus positive, currently hospitalized at our affiliated healthcare facility. For 2 weeks the patient has had weakness, decreased appetite, without vomiting, without diarrhea, without abdominal pain or chest pain. No dyspnea. However, today, after being evaluated by his physician, with possible hypoxia, he was sent here for evaluation.  Covid test sent earlier today.  Past Medical History:  Diagnosis Date  . Hypertension   . Thyroid disease     There are no active problems to display for this patient.   History reviewed. No pertinent surgical history.      Home Medications    Prior to Admission medications   Medication Sig Start Date End Date Taking? Authorizing Provider  hydrochlorothiazide (HYDRODIURIL) 25 MG tablet Take 25 mg by mouth daily.    [provider]    Family History No family history on file.  Social History Social History   Tobacco Use  . Smoking status: Never Smoker  . Smokeless tobacco: Never Used  Substance Use Topics  . Alcohol use: No  . Drug use: Never     Allergies   Patient has no known allergies.   Review of Systems Review of Systems  Constitutional:       Per HPI, otherwise negative  HENT:       Per HPI, otherwise negative  Respiratory:       Per HPI, otherwise negative  Cardiovascular:       Per HPI, otherwise negative  Gastrointestinal: Negative for  vomiting.  Endocrine:       Negative aside from HPI  Genitourinary:       Neg aside from HPI   Musculoskeletal:       Per HPI, otherwise negative  Skin: Negative.   Allergic/Immunologic: Negative for immunocompromised state.  Neurological: Positive for weakness. Negative for syncope.     Physical Exam Updated Vital Signs BP 134/81   Pulse 72   Temp 97.9 F (36.6 C) (Tympanic)   Resp 19   Ht 5\' 8"  (1.727 m)   Wt 88 kg   SpO2 96%   BMI 29.50 kg/m   Physical Exam Vitals signs and nursing note reviewed.  Constitutional:      General: He is not in acute distress.    Appearance: He is well-developed.  HENT:     Head: Normocephalic and atraumatic.  Eyes:     Conjunctiva/sclera: Conjunctivae normal.  Cardiovascular:     Rate and Rhythm: Normal rate and regular rhythm.  Pulmonary:     Effort: Pulmonary effort is normal. No respiratory distress.     Breath sounds: No stridor.  Abdominal:     General: There is no distension.     Tenderness: There is no abdominal tenderness. There is no guarding.  Skin:    General: Skin is warm and dry.  Neurological:     Mental Status: He is alert and oriented to person, place, and  time.      ED Treatments / Results  Labs (all labs ordered are listed, but only abnormal results are displayed) Labs Reviewed  BASIC METABOLIC PANEL - Abnormal; Notable for the following components:      Result Value   Potassium 3.0 (*)    Chloride 93 (*)    Glucose, Bld 101 (*)    Creatinine, Ser 1.38 (*)    Calcium 8.4 (*)    GFR calc non Af Amer 47 (*)    GFR calc Af Amer 54 (*)    All other components within normal limits  CBC - Abnormal; Notable for the following components:   WBC 3.7 (*)    All other components within normal limits  URINALYSIS, ROUTINE W REFLEX MICROSCOPIC  CBG MONITORING, ED    EKG EKG Interpretation  Date/Time:  Friday July 16 2019 18:22:22 EDT Ventricular Rate:  82 PR Interval:  144 QRS Duration: 78 QT  Interval:  364 QTC Calculation: 425 R Axis:   -33 Text Interpretation: Normal sinus rhythm Left axis deviation Low voltage QRS Abnormal ECG Confirmed by Carmin Muskrat [phone]) on 07/16/2019 8:55:09 PM   Radiology Dg Chest Port 1 View  Result Date: 07/16/2019 CLINICAL DATA:  85 year old male with generalized weakness and dizziness. EXAM: PORTABLE CHEST 1 VIEW COMPARISON:  None. FINDINGS: No focal consolidation, pleural effusion, pneumothorax. Background of emphysema. The cardiac silhouette is within normal limits. Atherosclerotic calcification of the aortic arch. No acute osseous pathology. IMPRESSION: No active cardiopulmonary disease. Electronically Signed   By: Anner Crete M.D.   On: 07/16/2019 22:18    Procedures Procedures (including critical care time)  Medications Ordered in ED Medications  sodium chloride 0.9 % bolus 1,000 mL (1,000 mLs Intravenous New Bag/Given 07/16/19 2234)     Initial Impression / Assessment and Plan / ED Course  I have reviewed the triage vital signs and the nursing notes.  Pertinent labs & imaging results that were available during my care of the patient were reviewed by me and considered in my medical decision making (see chart for details).       Initial labs notable for mild hypokalemia, mild dehydration with elevated creatinine.  Patient has received fluid resuscitation. X-ray reassuring, no evidence for pneumonia, and the patient has no hypoxia here.  (Late chart completion)  On reexam the patient was in no distress, awake, alert, stated that he felt better. Initial findings discussed with patient and his niece, all reassuring, with mild electrolyte abnormalities, no x-ray evidence for concerning findings.  Urinalysis pending. Patient's urinalysis returned unremarkable, after signout, and with this, improvement here, no ongoing complaints, there is no evidence for decompensated state.  Patient's Covid test was sent earlier in the day.  Patient appropriate for discharge.  Grant Martinez was evaluated in Emergency Department on 07/18/2019 for the symptoms described in the history of present illness. He was evaluated in the context of the global COVID-19 pandemic, which necessitated consideration that the patient might be at risk for infection with the SARS-CoV-2 virus that causes COVID-19. Institutional protocols and algorithms that pertain to the evaluation of patients at risk for COVID-19 are in a state of rapid change based on information released by regulatory bodies including the CDC and federal and state organizations. These policies and algorithms were followed during the patient's care in the ED.   Final Clinical Impressions(s) / ED Diagnoses   Final diagnoses:  Weakness  Dehydration  Hypokalemia     Carmin Muskrat, MD 07/18/19 1030

## 2019-07-17 LAB — URINALYSIS, ROUTINE W REFLEX MICROSCOPIC
Bilirubin Urine: NEGATIVE
Glucose, UA: NEGATIVE mg/dL
Hgb urine dipstick: NEGATIVE
Ketones, ur: 20 mg/dL — AB
Leukocytes,Ua: NEGATIVE
Nitrite: NEGATIVE
Protein, ur: NEGATIVE mg/dL
Specific Gravity, Urine: 1.018 (ref 1.005–1.030)
pH: 6 (ref 5.0–8.0)

## 2019-07-17 MED ORDER — POTASSIUM CHLORIDE CRYS ER 20 MEQ PO TBCR: 20.0000 meq | EXTENDED_RELEASE_TABLET | Freq: Every day | ORAL | 0 refills | Status: DC

## 2019-07-17 NOTE — ED Provider Notes (Signed)
Patient signed out to me by Dr. Vanita Panda to follow-up on labs.  Blood work is unremarkable.  Urinalysis has returned, no sign of infection.  Patient felt to be mildly dehydrated, was given IV fluids.  He does have a positive COVID-19 contact in his home.  Chest x-ray is clear, lungs are clear to auscultation.  He does not have any findings that would suggest he needs hospitalization at this time.  Caregiver was given warning signs for return to the hospital.  He had outpatient Covid testing performed this morning.   Orpah Greek, MD 07/17/19 [phone]

## 2019-07-18 LAB — NOVEL CORONAVIRUS, NAA: SARS-CoV-2, NAA: DETECTED — AB

## 2019-08-05 DIAGNOSIS — R42 Dizziness and giddiness: Secondary | ICD-10-CM | POA: Diagnosis not present

## 2019-08-05 DIAGNOSIS — I6523 Occlusion and stenosis of bilateral carotid arteries: Secondary | ICD-10-CM | POA: Diagnosis not present

## 2019-08-09 DIAGNOSIS — I517 Cardiomegaly: Secondary | ICD-10-CM | POA: Diagnosis not present

## 2019-08-09 DIAGNOSIS — R55 Syncope and collapse: Secondary | ICD-10-CM | POA: Diagnosis not present

## 2019-08-09 DIAGNOSIS — I371 Nonrheumatic pulmonary valve insufficiency: Secondary | ICD-10-CM | POA: Diagnosis not present

## 2019-08-09 DIAGNOSIS — R42 Dizziness and giddiness: Secondary | ICD-10-CM | POA: Diagnosis not present

## 2019-09-29 DIAGNOSIS — Z8546 Personal history of malignant neoplasm of prostate: Secondary | ICD-10-CM | POA: Diagnosis not present

## 2019-10-04 DIAGNOSIS — C61 Malignant neoplasm of prostate: Secondary | ICD-10-CM | POA: Diagnosis not present

## 2019-10-04 DIAGNOSIS — R9721 Rising PSA following treatment for malignant neoplasm of prostate: Secondary | ICD-10-CM | POA: Diagnosis not present

## 2019-12-20 DIAGNOSIS — I10 Essential (primary) hypertension: Secondary | ICD-10-CM | POA: Diagnosis not present

## 2019-12-20 DIAGNOSIS — N182 Chronic kidney disease, stage 2 (mild): Secondary | ICD-10-CM | POA: Diagnosis not present

## 2019-12-20 DIAGNOSIS — E039 Hypothyroidism, unspecified: Secondary | ICD-10-CM | POA: Diagnosis not present

## 2019-12-20 DIAGNOSIS — C61 Malignant neoplasm of prostate: Secondary | ICD-10-CM | POA: Diagnosis not present

## 2019-12-20 DIAGNOSIS — R739 Hyperglycemia, unspecified: Secondary | ICD-10-CM | POA: Diagnosis not present

## 2019-12-20 DIAGNOSIS — E782 Mixed hyperlipidemia: Secondary | ICD-10-CM | POA: Diagnosis not present

## 2019-12-20 DIAGNOSIS — R7303 Prediabetes: Secondary | ICD-10-CM | POA: Diagnosis not present

## 2019-12-27 DIAGNOSIS — Z8546 Personal history of malignant neoplasm of prostate: Secondary | ICD-10-CM | POA: Diagnosis not present

## 2019-12-28 DIAGNOSIS — E782 Mixed hyperlipidemia: Secondary | ICD-10-CM | POA: Diagnosis not present

## 2019-12-28 DIAGNOSIS — Z6829 Body mass index (BMI) 29.0-29.9, adult: Secondary | ICD-10-CM | POA: Diagnosis not present

## 2019-12-28 DIAGNOSIS — I10 Essential (primary) hypertension: Secondary | ICD-10-CM | POA: Diagnosis not present

## 2019-12-28 DIAGNOSIS — R7303 Prediabetes: Secondary | ICD-10-CM | POA: Diagnosis not present

## 2019-12-28 DIAGNOSIS — E039 Hypothyroidism, unspecified: Secondary | ICD-10-CM | POA: Diagnosis not present

## 2019-12-28 DIAGNOSIS — N182 Chronic kidney disease, stage 2 (mild): Secondary | ICD-10-CM | POA: Diagnosis not present

## 2019-12-28 DIAGNOSIS — R42 Dizziness and giddiness: Secondary | ICD-10-CM | POA: Diagnosis not present

## 2020-01-03 DIAGNOSIS — C61 Malignant neoplasm of prostate: Secondary | ICD-10-CM | POA: Diagnosis not present

## 2020-03-28 DIAGNOSIS — R9721 Rising PSA following treatment for malignant neoplasm of prostate: Secondary | ICD-10-CM | POA: Diagnosis not present

## 2020-04-04 DIAGNOSIS — C61 Malignant neoplasm of prostate: Secondary | ICD-10-CM | POA: Diagnosis not present

## 2020-06-23 DIAGNOSIS — R739 Hyperglycemia, unspecified: Secondary | ICD-10-CM | POA: Diagnosis not present

## 2020-06-23 DIAGNOSIS — I10 Essential (primary) hypertension: Secondary | ICD-10-CM | POA: Diagnosis not present

## 2020-06-23 DIAGNOSIS — E039 Hypothyroidism, unspecified: Secondary | ICD-10-CM | POA: Diagnosis not present

## 2020-06-23 DIAGNOSIS — E559 Vitamin D deficiency, unspecified: Secondary | ICD-10-CM | POA: Diagnosis not present

## 2020-06-23 DIAGNOSIS — E782 Mixed hyperlipidemia: Secondary | ICD-10-CM | POA: Diagnosis not present

## 2020-06-28 DIAGNOSIS — Z6830 Body mass index (BMI) 30.0-30.9, adult: Secondary | ICD-10-CM | POA: Diagnosis not present

## 2020-06-28 DIAGNOSIS — Z0001 Encounter for general adult medical examination with abnormal findings: Secondary | ICD-10-CM | POA: Diagnosis not present

## 2020-06-28 DIAGNOSIS — N182 Chronic kidney disease, stage 2 (mild): Secondary | ICD-10-CM | POA: Diagnosis not present

## 2020-06-28 DIAGNOSIS — R7303 Prediabetes: Secondary | ICD-10-CM | POA: Diagnosis not present

## 2020-06-28 DIAGNOSIS — I10 Essential (primary) hypertension: Secondary | ICD-10-CM | POA: Diagnosis not present

## 2020-06-28 DIAGNOSIS — Z23 Encounter for immunization: Secondary | ICD-10-CM | POA: Diagnosis not present

## 2020-06-28 DIAGNOSIS — C61 Malignant neoplasm of prostate: Secondary | ICD-10-CM | POA: Diagnosis not present

## 2020-06-28 DIAGNOSIS — E782 Mixed hyperlipidemia: Secondary | ICD-10-CM | POA: Diagnosis not present

## 2020-09-26 DIAGNOSIS — Z8546 Personal history of malignant neoplasm of prostate: Secondary | ICD-10-CM | POA: Diagnosis not present

## 2020-10-03 DIAGNOSIS — R350 Frequency of micturition: Secondary | ICD-10-CM | POA: Diagnosis not present

## 2020-10-03 DIAGNOSIS — C61 Malignant neoplasm of prostate: Secondary | ICD-10-CM | POA: Diagnosis not present

## 2020-10-09 ENCOUNTER — Other Ambulatory Visit (HOSPITAL_COMMUNITY): Payer: Self-pay | Admitting: Urology

## 2020-10-09 DIAGNOSIS — R9721 Rising PSA following treatment for malignant neoplasm of prostate: Secondary | ICD-10-CM

## 2020-10-19 ENCOUNTER — Ambulatory Visit (HOSPITAL_COMMUNITY)
Admission: RE | Admit: 2020-10-19 | Discharge: 2020-10-19 | Disposition: A | Discharge status: Home / Self Care | Payer: Medicare HMO | Source: Ambulatory Visit | Attending: Urology | Admitting: Urology

## 2020-10-19 ENCOUNTER — Other Ambulatory Visit: Payer: Self-pay

## 2020-10-19 DIAGNOSIS — C61 Malignant neoplasm of prostate: Secondary | ICD-10-CM | POA: Diagnosis not present

## 2020-10-19 DIAGNOSIS — R9721 Rising PSA following treatment for malignant neoplasm of prostate: Secondary | ICD-10-CM | POA: Insufficient documentation

## 2020-10-19 MED ORDER — PIFLIFOLASTAT F 18 (PYLARIFY) INJECTION
9.0000 | Freq: Once | INTRAVENOUS | Status: AC
  Administered 2020-10-19: 9.7 via INTRAVENOUS

## 2020-12-05 DIAGNOSIS — R9721 Rising PSA following treatment for malignant neoplasm of prostate: Secondary | ICD-10-CM | POA: Diagnosis not present

## 2020-12-12 DIAGNOSIS — R9721 Rising PSA following treatment for malignant neoplasm of prostate: Secondary | ICD-10-CM | POA: Diagnosis not present

## 2020-12-12 DIAGNOSIS — C61 Malignant neoplasm of prostate: Secondary | ICD-10-CM | POA: Diagnosis not present

## 2020-12-12 DIAGNOSIS — R350 Frequency of micturition: Secondary | ICD-10-CM | POA: Diagnosis not present

## 2020-12-27 DIAGNOSIS — I10 Essential (primary) hypertension: Secondary | ICD-10-CM | POA: Diagnosis not present

## 2020-12-27 DIAGNOSIS — E7849 Other hyperlipidemia: Secondary | ICD-10-CM | POA: Diagnosis not present

## 2020-12-27 DIAGNOSIS — E039 Hypothyroidism, unspecified: Secondary | ICD-10-CM | POA: Diagnosis not present

## 2020-12-27 DIAGNOSIS — R739 Hyperglycemia, unspecified: Secondary | ICD-10-CM | POA: Diagnosis not present

## 2020-12-27 DIAGNOSIS — N182 Chronic kidney disease, stage 2 (mild): Secondary | ICD-10-CM | POA: Diagnosis not present

## 2020-12-27 DIAGNOSIS — E559 Vitamin D deficiency, unspecified: Secondary | ICD-10-CM | POA: Diagnosis not present

## 2020-12-27 DIAGNOSIS — E782 Mixed hyperlipidemia: Secondary | ICD-10-CM | POA: Diagnosis not present

## 2021-01-01 DIAGNOSIS — C61 Malignant neoplasm of prostate: Secondary | ICD-10-CM | POA: Diagnosis not present

## 2021-01-01 DIAGNOSIS — I10 Essential (primary) hypertension: Secondary | ICD-10-CM | POA: Diagnosis not present

## 2021-01-01 DIAGNOSIS — R7303 Prediabetes: Secondary | ICD-10-CM | POA: Diagnosis not present

## 2021-01-01 DIAGNOSIS — E039 Hypothyroidism, unspecified: Secondary | ICD-10-CM | POA: Diagnosis not present

## 2021-01-01 DIAGNOSIS — E7849 Other hyperlipidemia: Secondary | ICD-10-CM | POA: Diagnosis not present

## 2021-01-01 DIAGNOSIS — E559 Vitamin D deficiency, unspecified: Secondary | ICD-10-CM | POA: Diagnosis not present

## 2021-01-01 DIAGNOSIS — N182 Chronic kidney disease, stage 2 (mild): Secondary | ICD-10-CM | POA: Diagnosis not present

## 2021-01-01 DIAGNOSIS — K4090 Unilateral inguinal hernia, without obstruction or gangrene, not specified as recurrent: Secondary | ICD-10-CM | POA: Diagnosis not present

## 2021-01-15 DIAGNOSIS — K4020 Bilateral inguinal hernia, without obstruction or gangrene, not specified as recurrent: Secondary | ICD-10-CM | POA: Diagnosis not present

## 2021-02-17 DIAGNOSIS — R059 Cough, unspecified: Secondary | ICD-10-CM | POA: Diagnosis not present

## 2021-02-17 DIAGNOSIS — Z20828 Contact with and (suspected) exposure to other viral communicable diseases: Secondary | ICD-10-CM | POA: Diagnosis not present

## 2021-03-06 DIAGNOSIS — R9721 Rising PSA following treatment for malignant neoplasm of prostate: Secondary | ICD-10-CM | POA: Diagnosis not present

## 2021-03-13 DIAGNOSIS — Z8546 Personal history of malignant neoplasm of prostate: Secondary | ICD-10-CM | POA: Diagnosis not present

## 2021-07-10 DIAGNOSIS — E7849 Other hyperlipidemia: Secondary | ICD-10-CM | POA: Diagnosis not present

## 2021-07-10 DIAGNOSIS — E782 Mixed hyperlipidemia: Secondary | ICD-10-CM | POA: Diagnosis not present

## 2021-07-10 DIAGNOSIS — R7303 Prediabetes: Secondary | ICD-10-CM | POA: Diagnosis not present

## 2021-07-10 DIAGNOSIS — I10 Essential (primary) hypertension: Secondary | ICD-10-CM | POA: Diagnosis not present

## 2021-07-10 DIAGNOSIS — N182 Chronic kidney disease, stage 2 (mild): Secondary | ICD-10-CM | POA: Diagnosis not present

## 2021-07-12 DIAGNOSIS — E7849 Other hyperlipidemia: Secondary | ICD-10-CM | POA: Diagnosis not present

## 2021-07-12 DIAGNOSIS — R7303 Prediabetes: Secondary | ICD-10-CM | POA: Diagnosis not present

## 2021-07-12 DIAGNOSIS — Z23 Encounter for immunization: Secondary | ICD-10-CM | POA: Diagnosis not present

## 2021-07-12 DIAGNOSIS — M1712 Unilateral primary osteoarthritis, left knee: Secondary | ICD-10-CM | POA: Diagnosis not present

## 2021-07-12 DIAGNOSIS — N183 Chronic kidney disease, stage 3 unspecified: Secondary | ICD-10-CM | POA: Diagnosis not present

## 2021-07-12 DIAGNOSIS — I10 Essential (primary) hypertension: Secondary | ICD-10-CM | POA: Diagnosis not present

## 2021-07-12 DIAGNOSIS — M1711 Unilateral primary osteoarthritis, right knee: Secondary | ICD-10-CM | POA: Diagnosis not present

## 2021-07-12 DIAGNOSIS — Z0001 Encounter for general adult medical examination with abnormal findings: Secondary | ICD-10-CM | POA: Diagnosis not present

## 2021-07-16 DIAGNOSIS — N183 Chronic kidney disease, stage 3 unspecified: Secondary | ICD-10-CM | POA: Diagnosis not present

## 2021-07-16 DIAGNOSIS — E039 Hypothyroidism, unspecified: Secondary | ICD-10-CM | POA: Diagnosis not present

## 2021-07-16 DIAGNOSIS — R739 Hyperglycemia, unspecified: Secondary | ICD-10-CM | POA: Diagnosis not present

## 2021-07-16 DIAGNOSIS — E782 Mixed hyperlipidemia: Secondary | ICD-10-CM | POA: Diagnosis not present

## 2021-08-30 DIAGNOSIS — R9721 Rising PSA following treatment for malignant neoplasm of prostate: Secondary | ICD-10-CM | POA: Diagnosis not present

## 2021-09-07 DIAGNOSIS — C61 Malignant neoplasm of prostate: Secondary | ICD-10-CM | POA: Diagnosis not present

## 2021-09-07 DIAGNOSIS — R9721 Rising PSA following treatment for malignant neoplasm of prostate: Secondary | ICD-10-CM | POA: Diagnosis not present

## 2021-09-07 DIAGNOSIS — R350 Frequency of micturition: Secondary | ICD-10-CM | POA: Diagnosis not present

## 2022-01-04 DIAGNOSIS — E782 Mixed hyperlipidemia: Secondary | ICD-10-CM | POA: Diagnosis not present

## 2022-01-04 DIAGNOSIS — E7849 Other hyperlipidemia: Secondary | ICD-10-CM | POA: Diagnosis not present

## 2022-01-04 DIAGNOSIS — N189 Chronic kidney disease, unspecified: Secondary | ICD-10-CM | POA: Diagnosis not present

## 2022-01-04 DIAGNOSIS — N182 Chronic kidney disease, stage 2 (mild): Secondary | ICD-10-CM | POA: Diagnosis not present

## 2022-01-04 DIAGNOSIS — I10 Essential (primary) hypertension: Secondary | ICD-10-CM | POA: Diagnosis not present

## 2022-01-04 DIAGNOSIS — E039 Hypothyroidism, unspecified: Secondary | ICD-10-CM | POA: Diagnosis not present

## 2022-01-04 DIAGNOSIS — E559 Vitamin D deficiency, unspecified: Secondary | ICD-10-CM | POA: Diagnosis not present

## 2022-01-08 DIAGNOSIS — R7303 Prediabetes: Secondary | ICD-10-CM | POA: Diagnosis not present

## 2022-01-08 DIAGNOSIS — Z0001 Encounter for general adult medical examination with abnormal findings: Secondary | ICD-10-CM | POA: Diagnosis not present

## 2022-01-08 DIAGNOSIS — C61 Malignant neoplasm of prostate: Secondary | ICD-10-CM | POA: Diagnosis not present

## 2022-01-08 DIAGNOSIS — I10 Essential (primary) hypertension: Secondary | ICD-10-CM | POA: Diagnosis not present

## 2022-01-08 DIAGNOSIS — N183 Chronic kidney disease, stage 3 unspecified: Secondary | ICD-10-CM | POA: Diagnosis not present

## 2022-01-08 DIAGNOSIS — E7849 Other hyperlipidemia: Secondary | ICD-10-CM | POA: Diagnosis not present

## 2022-01-08 DIAGNOSIS — E039 Hypothyroidism, unspecified: Secondary | ICD-10-CM | POA: Diagnosis not present

## 2022-01-08 DIAGNOSIS — E559 Vitamin D deficiency, unspecified: Secondary | ICD-10-CM | POA: Diagnosis not present

## 2022-02-20 DIAGNOSIS — H524 Presbyopia: Secondary | ICD-10-CM | POA: Diagnosis not present

## 2022-02-20 DIAGNOSIS — H5213 Myopia, bilateral: Secondary | ICD-10-CM | POA: Diagnosis not present

## 2022-02-20 DIAGNOSIS — H5203 Hypermetropia, bilateral: Secondary | ICD-10-CM | POA: Diagnosis not present

## 2022-02-20 DIAGNOSIS — H52209 Unspecified astigmatism, unspecified eye: Secondary | ICD-10-CM | POA: Diagnosis not present

## 2022-02-26 DIAGNOSIS — R972 Elevated prostate specific antigen [PSA]: Secondary | ICD-10-CM | POA: Diagnosis not present

## 2022-03-05 DIAGNOSIS — R9721 Rising PSA following treatment for malignant neoplasm of prostate: Secondary | ICD-10-CM | POA: Diagnosis not present

## 2022-03-05 DIAGNOSIS — R350 Frequency of micturition: Secondary | ICD-10-CM | POA: Diagnosis not present

## 2022-03-05 DIAGNOSIS — Z8546 Personal history of malignant neoplasm of prostate: Secondary | ICD-10-CM | POA: Diagnosis not present

## 2022-03-18 DIAGNOSIS — M25562 Pain in left knee: Secondary | ICD-10-CM | POA: Diagnosis not present

## 2022-05-18 DIAGNOSIS — Z6827 Body mass index (BMI) 27.0-27.9, adult: Secondary | ICD-10-CM | POA: Diagnosis not present

## 2022-05-18 DIAGNOSIS — M25541 Pain in joints of right hand: Secondary | ICD-10-CM | POA: Diagnosis not present

## 2022-05-18 DIAGNOSIS — R030 Elevated blood-pressure reading, without diagnosis of hypertension: Secondary | ICD-10-CM | POA: Diagnosis not present

## 2022-05-28 DIAGNOSIS — R9721 Rising PSA following treatment for malignant neoplasm of prostate: Secondary | ICD-10-CM | POA: Diagnosis not present

## 2022-06-04 DIAGNOSIS — C61 Malignant neoplasm of prostate: Secondary | ICD-10-CM | POA: Diagnosis not present

## 2022-06-18 ENCOUNTER — Other Ambulatory Visit: Payer: Self-pay

## 2022-06-18 ENCOUNTER — Emergency Department (HOSPITAL_COMMUNITY): Payer: Medicare HMO

## 2022-06-18 ENCOUNTER — Encounter (HOSPITAL_COMMUNITY): Payer: Self-pay | Admitting: Emergency Medicine

## 2022-06-18 ENCOUNTER — Inpatient Hospital Stay (HOSPITAL_COMMUNITY): Payer: Medicare HMO

## 2022-06-18 ENCOUNTER — Encounter (HOSPITAL_COMMUNITY): Payer: Self-pay

## 2022-06-18 ENCOUNTER — Inpatient Hospital Stay (HOSPITAL_COMMUNITY)
Admission: EM | Admit: 2022-06-18 | Discharge: 2022-06-21 | DRG: 086 | Disposition: A | Discharge status: Home Health | Payer: Medicare HMO | Attending: Internal Medicine | Admitting: Internal Medicine

## 2022-06-18 DIAGNOSIS — E785 Hyperlipidemia, unspecified: Secondary | ICD-10-CM | POA: Diagnosis not present

## 2022-06-18 DIAGNOSIS — E860 Dehydration: Secondary | ICD-10-CM | POA: Diagnosis not present

## 2022-06-18 DIAGNOSIS — S065XAA Traumatic subdural hemorrhage with loss of consciousness status unknown, initial encounter: Principal | ICD-10-CM | POA: Diagnosis present

## 2022-06-18 DIAGNOSIS — I6523 Occlusion and stenosis of bilateral carotid arteries: Secondary | ICD-10-CM | POA: Diagnosis not present

## 2022-06-18 DIAGNOSIS — M6282 Rhabdomyolysis: Secondary | ICD-10-CM | POA: Diagnosis not present

## 2022-06-18 DIAGNOSIS — N179 Acute kidney failure, unspecified: Secondary | ICD-10-CM | POA: Diagnosis present

## 2022-06-18 DIAGNOSIS — W19XXXA Unspecified fall, initial encounter: Secondary | ICD-10-CM | POA: Diagnosis not present

## 2022-06-18 DIAGNOSIS — Y92009 Unspecified place in unspecified non-institutional (private) residence as the place of occurrence of the external cause: Secondary | ICD-10-CM

## 2022-06-18 DIAGNOSIS — G8929 Other chronic pain: Secondary | ICD-10-CM | POA: Diagnosis present

## 2022-06-18 DIAGNOSIS — M778 Other enthesopathies, not elsewhere classified: Secondary | ICD-10-CM | POA: Diagnosis not present

## 2022-06-18 DIAGNOSIS — R29810 Facial weakness: Secondary | ICD-10-CM | POA: Diagnosis not present

## 2022-06-18 DIAGNOSIS — Z79899 Other long term (current) drug therapy: Secondary | ICD-10-CM

## 2022-06-18 DIAGNOSIS — S065X0A Traumatic subdural hemorrhage without loss of consciousness, initial encounter: Principal | ICD-10-CM | POA: Diagnosis present

## 2022-06-18 DIAGNOSIS — C61 Malignant neoplasm of prostate: Secondary | ICD-10-CM | POA: Diagnosis not present

## 2022-06-18 DIAGNOSIS — I10 Essential (primary) hypertension: Secondary | ICD-10-CM | POA: Diagnosis present

## 2022-06-18 DIAGNOSIS — Z8249 Family history of ischemic heart disease and other diseases of the circulatory system: Secondary | ICD-10-CM | POA: Diagnosis not present

## 2022-06-18 DIAGNOSIS — M25562 Pain in left knee: Secondary | ICD-10-CM | POA: Diagnosis present

## 2022-06-18 DIAGNOSIS — M25522 Pain in left elbow: Secondary | ICD-10-CM | POA: Diagnosis present

## 2022-06-18 DIAGNOSIS — Z7952 Long term (current) use of systemic steroids: Secondary | ICD-10-CM | POA: Diagnosis not present

## 2022-06-18 DIAGNOSIS — M25462 Effusion, left knee: Secondary | ICD-10-CM | POA: Diagnosis present

## 2022-06-18 DIAGNOSIS — W1830XA Fall on same level, unspecified, initial encounter: Secondary | ICD-10-CM | POA: Diagnosis present

## 2022-06-18 DIAGNOSIS — M25561 Pain in right knee: Secondary | ICD-10-CM | POA: Diagnosis present

## 2022-06-18 DIAGNOSIS — Z79890 Hormone replacement therapy (postmenopausal): Secondary | ICD-10-CM

## 2022-06-18 DIAGNOSIS — Z043 Encounter for examination and observation following other accident: Secondary | ICD-10-CM | POA: Diagnosis not present

## 2022-06-18 DIAGNOSIS — Z23 Encounter for immunization: Secondary | ICD-10-CM

## 2022-06-18 DIAGNOSIS — E876 Hypokalemia: Secondary | ICD-10-CM | POA: Diagnosis present

## 2022-06-18 DIAGNOSIS — R471 Dysarthria and anarthria: Secondary | ICD-10-CM | POA: Diagnosis present

## 2022-06-18 DIAGNOSIS — R4781 Slurred speech: Secondary | ICD-10-CM | POA: Diagnosis not present

## 2022-06-18 DIAGNOSIS — R600 Localized edema: Secondary | ICD-10-CM | POA: Diagnosis not present

## 2022-06-18 DIAGNOSIS — M7989 Other specified soft tissue disorders: Secondary | ICD-10-CM | POA: Diagnosis not present

## 2022-06-18 DIAGNOSIS — S065XAD Traumatic subdural hemorrhage with loss of consciousness status unknown, subsequent encounter: Secondary | ICD-10-CM | POA: Diagnosis not present

## 2022-06-18 DIAGNOSIS — I6200 Nontraumatic subdural hemorrhage, unspecified: Secondary | ICD-10-CM | POA: Diagnosis not present

## 2022-06-18 DIAGNOSIS — I672 Cerebral atherosclerosis: Secondary | ICD-10-CM | POA: Diagnosis not present

## 2022-06-18 DIAGNOSIS — M109 Gout, unspecified: Secondary | ICD-10-CM | POA: Diagnosis present

## 2022-06-18 DIAGNOSIS — R7989 Other specified abnormal findings of blood chemistry: Secondary | ICD-10-CM | POA: Diagnosis not present

## 2022-06-18 DIAGNOSIS — S199XXA Unspecified injury of neck, initial encounter: Secondary | ICD-10-CM | POA: Diagnosis not present

## 2022-06-18 DIAGNOSIS — M2578 Osteophyte, vertebrae: Secondary | ICD-10-CM | POA: Diagnosis not present

## 2022-06-18 DIAGNOSIS — S0181XA Laceration without foreign body of other part of head, initial encounter: Secondary | ICD-10-CM | POA: Diagnosis not present

## 2022-06-18 HISTORY — DX: Gout, unspecified: M10.9

## 2022-06-18 HISTORY — DX: Malignant neoplasm of prostate: C61

## 2022-06-18 HISTORY — DX: Hyperlipidemia, unspecified: E78.5

## 2022-06-18 LAB — COMPREHENSIVE METABOLIC PANEL
ALT: 72 U/L — ABNORMAL HIGH (ref 0–44)
AST: 192 U/L — ABNORMAL HIGH (ref 15–41)
Albumin: 4.2 g/dL (ref 3.5–5.0)
Alkaline Phosphatase: 62 U/L (ref 38–126)
Anion gap: 14 (ref 5–15)
BUN: 22 mg/dL (ref 8–23)
CO2: 26 mmol/L (ref 22–32)
Calcium: 9.3 mg/dL (ref 8.9–10.3)
Chloride: 105 mmol/L (ref 98–111)
Creatinine, Ser: 1.53 mg/dL — ABNORMAL HIGH (ref 0.61–1.24)
GFR, Estimated: 44 mL/min — ABNORMAL LOW (ref >60)
Glucose, Bld: 121 mg/dL — ABNORMAL HIGH (ref 70–99)
Potassium: 3.3 mmol/L — ABNORMAL LOW (ref 3.5–5.1)
Sodium: 145 mmol/L (ref 135–145)
Total Bilirubin: 2.4 mg/dL — ABNORMAL HIGH (ref 0.3–1.2)
Total Protein: 7.1 g/dL (ref 6.5–8.1)

## 2022-06-18 LAB — URINALYSIS, ROUTINE W REFLEX MICROSCOPIC
Bilirubin Urine: NEGATIVE
Glucose, UA: NEGATIVE mg/dL
Ketones, ur: 20 mg/dL — AB
Leukocytes,Ua: NEGATIVE
Nitrite: NEGATIVE
Protein, ur: 100 mg/dL — AB
Specific Gravity, Urine: 1.027 (ref 1.005–1.030)
pH: 5 (ref 5.0–8.0)

## 2022-06-18 LAB — CBC WITH DIFFERENTIAL/PLATELET
Abs Immature Granulocytes: 0.05 10*3/uL (ref 0.00–0.07)
Basophils Absolute: 0.1 10*3/uL (ref 0.0–0.1)
Basophils Relative: 0 %
Eosinophils Absolute: 0.4 10*3/uL (ref 0.0–0.5)
Eosinophils Relative: 3 %
HCT: 46.2 % (ref 39.0–52.0)
Hemoglobin: 15.9 g/dL (ref 13.0–17.0)
Immature Granulocytes: 0 %
Lymphocytes Relative: 7 %
Lymphs Abs: 0.9 10*3/uL (ref 0.7–4.0)
MCH: 31 pg (ref 26.0–34.0)
MCHC: 34.4 g/dL (ref 30.0–36.0)
MCV: 90.1 fL (ref 80.0–100.0)
Monocytes Absolute: 1.3 10*3/uL — ABNORMAL HIGH (ref 0.1–1.0)
Monocytes Relative: 9 %
Neutro Abs: 10.9 10*3/uL — ABNORMAL HIGH (ref 1.7–7.7)
Neutrophils Relative %: 81 %
Platelets: 265 10*3/uL (ref 150–400)
RBC: 5.13 MIL/uL (ref 4.22–5.81)
RDW: 14.1 % (ref 11.5–15.5)
WBC: 13.5 10*3/uL — ABNORMAL HIGH (ref 4.0–10.5)
nRBC: 0 % (ref 0.0–0.2)

## 2022-06-18 LAB — TROPONIN I (HIGH SENSITIVITY)
Troponin I (High Sensitivity): 321 ng/L (ref <18)
Troponin I (High Sensitivity): 272 ng/L (ref <18)

## 2022-06-18 LAB — CK: Total CK: 7958 U/L — ABNORMAL HIGH (ref 49–397)

## 2022-06-18 LAB — PROTIME-INR
INR: 1.2 (ref 0.8–1.2)
Prothrombin Time: 14.8 seconds (ref 11.4–15.2)

## 2022-06-18 MED ORDER — LACTATED RINGERS IV SOLN: INTRAVENOUS | Status: AC

## 2022-06-18 MED ORDER — POTASSIUM CHLORIDE CRYS ER 20 MEQ PO TBCR: 40.0000 meq | EXTENDED_RELEASE_TABLET | Freq: Once | ORAL | Status: DC

## 2022-06-18 MED ORDER — TETANUS-DIPHTH-ACELL PERTUSSIS 5-2.5-18.5 LF-MCG/0.5 IM SUSY
0.5000 mL | PREFILLED_SYRINGE | Freq: Once | INTRAMUSCULAR | Status: AC
  Administered 2022-06-18: 0.5 mL via INTRAMUSCULAR
  Filled 2022-06-18: qty 0.5

## 2022-06-18 MED ORDER — TETANUS-DIPHTH-ACELL PERTUSSIS 5-2.5-18.5 LF-MCG/0.5 IM SUSY: 0.5000 mL | PREFILLED_SYRINGE | Freq: Once | INTRAMUSCULAR | Status: DC

## 2022-06-18 MED ORDER — POTASSIUM CHLORIDE 10 MEQ/100ML IV SOLN
10.0000 meq | Freq: Once | INTRAVENOUS | Status: AC
  Administered 2022-06-18: 10 meq via INTRAVENOUS
  Filled 2022-06-18: qty 100

## 2022-06-18 MED ORDER — SODIUM CHLORIDE 0.9 % IV BOLUS
500.0000 mL | Freq: Once | INTRAVENOUS | Status: AC
  Administered 2022-06-18: 500 mL via INTRAVENOUS

## 2022-06-18 MED ORDER — ACETAMINOPHEN 500 MG PO TABS
1000.0000 mg | ORAL_TABLET | Freq: Once | ORAL | Status: AC
  Administered 2022-06-18: 1000 mg via ORAL
  Filled 2022-06-18: qty 2

## 2022-06-18 MED ORDER — LACTATED RINGERS IV BOLUS
1000.0000 mL | Freq: Once | INTRAVENOUS | Status: AC
  Administered 2022-06-18: 1000 mL via INTRAVENOUS

## 2022-06-18 MED ORDER — LIDOCAINE HCL (PF) 1 % IJ SOLN
10.0000 mL | Freq: Once | INTRAMUSCULAR | Status: AC
  Administered 2022-06-18: 10 mL
  Filled 2022-06-18: qty 10

## 2022-06-18 NOTE — H&P (Signed)
History and Physical    Grant Martinez FTD:[id number] DOB: 1933-09-21 DOA: 06/18/2022  PCP: Curlene Labrum, MD   Patient coming from: Home  I have personally briefly reviewed patient's old medical records in [place]  Chief Complaint: Found down  HPI: Grant Martinez is a 88 y.o. male with medical history significant for hypertension, gout, prostate cancer.  Patient was brought to the ED after he was found on the floor by his niece this afternoon. Patient's niece went by the house- checked on patient. She has a key to open the door, patient was in his bedroom lying on the floor facedown.  Patient reports he was walking in his room, turned around and fell.  He remembers falling, he hit his head.  He did not lose consciousness.  He has had problems with his left knee over the past several months with swelling and pain requiring injections.  Because of this he very frequently drags his left feet, not lifting it off the floor.  He is not sure why he fell.  But he denies difficulty breathing.  No chest pain.  No dizziness.  He denies any weakness of his extremities.  He was conscious the whole time he was on the floor, has not had anything to eat or drink since he fell, he was not with his phone also.  Niece and her husband at bedside reports new mild slurring of his speech, and right facial droop-new from the last time they saw him a day or 2 ago.  ED Course: Stable vitals.  Troponin 321.  CK 7958.  Leukocytosis of 13.5.  Potassium 3.3. CT head-shows acute subdural hematoma on the right, largest complaint along the lateral convexity measuring up to 11 mm in thickness.  Smaller component measuring 3 mm.  Mild mass effect.  Also small chronic subdural hematoma. EDP talked to neurosurgeon Dr. Glenford Peers, admit to [facility].  Repeat CT head 6 hours after the first.  Tetanus booster given.  1 L bolus given.  Review of Systems: As per HPI all other systems reviewed and negative.  Past  Medical History:  Diagnosis Date   Gout    Hyperlipemia    Hypertension    Prostate cancer ([place])    Thyroid disease     History reviewed. No pertinent surgical history.   reports that he has never smoked. He has never used smokeless tobacco. He reports that he does not drink alcohol and does not use drugs.  No Known Allergies  Family history of hypertension.  Prior to Admission medications   Medication Sig Start Date End Date Taking? Authorizing Provider  atorvastatin (LIPITOR) 10 MG tablet Take 1 tablet by mouth daily.   Yes [provider]  Cholecalciferol (VITAMIN D3) 125 MCG (5000 UT) CAPS Take 1 capsule by mouth daily.   Yes [provider]  colchicine 0.6 MG tablet Take 0.6 mg by mouth daily as needed (gout flare-ups).   Yes [provider]  diclofenac Sodium (VOLTAREN) 1 % GEL Apply 2 g topically 4 (four) times daily. 03/18/22  Yes [provider]  hydrochlorothiazide (HYDRODIURIL) 25 MG tablet Take 25 mg by mouth daily.   Yes [provider]  Leuprolide Acetate, 3 Month, (ELIGARD) 22.5 MG injection Inject 22.5 mg into the skin every 3 (three) months.   Yes [provider]  levothyroxine (SYNTHROID) 50 MCG tablet Take 50 mcg by mouth daily. 05/23/22  Yes [provider]  Multiple Vitamin (MULTIVITAMIN) tablet Take 1  tablet by mouth daily.   Yes [provider]  OVER THE COUNTER MEDICATION Blue emu   Yes [provider]  MYRBETRIQ 50 MG TB24 tablet  03/05/22   [provider]  pravastatin (PRAVACHOL) 20 MG tablet Take 20 mg by mouth daily. Patient not taking: Reported on 06/18/2022 04/10/22   [provider]  predniSONE (DELTASONE) 20 MG tablet Take 40 mg by mouth daily. Patient not taking: Reported on 06/18/2022 05/18/22   [provider]    Physical Exam: Vitals:   06/18/22 1830 06/18/22 1900 06/18/22 1930 06/18/22 2000  BP: 127/68 120/81 116/84 (!) 118/58  Pulse: 96 93 86  92  Resp: 14 (!) '21 17 18  '$ Temp:      TempSrc:      SpO2: 95% 98% 96% 100%  Weight:      Height:        Constitutional:  ~ 5 - 6cm deep laceration to left frontal area of skull, otherwise calm, comfortable Vitals:   06/18/22 1830 06/18/22 1900 06/18/22 1930 06/18/22 2000  BP: 127/68 120/81 116/84 (!) 118/58  Pulse: 96 93 86 92  Resp: 14 (!) '21 17 18  '$ Temp:      TempSrc:      SpO2: 95% 98% 96% 100%  Weight:      Height:       Eyes: Discoloration to skin surrounding left eye  ENMT: Mucous membranes are moist.  Neck: normal, supple, no masses, no thyromegaly Respiratory: clear to auscultation bilaterally, no wheezing, no crackles. Normal respiratory effort. No accessory muscle use.  Cardiovascular: Regular rate and rhythm, no murmurs / rubs / gallops. No extremity edema.  Extremities warm. Abdomen: no tenderness, no masses palpated. No hepatosplenomegaly. Bowel sounds positive.  Musculoskeletal: no clubbing / cyanosis.  Swelling to left knee, chronic, no tenderness, no differential warmth, localized discoloration to knee from subcutaneous bruising from fall,  no contractures. Normal muscle tone.  Skin: no rashes, lesions, ulcers. No induration Neurologic: 5/5 strength bilateral upper extremity, 5 out of 5 strength right lower extremity, able to move left lower extremity against gravity, but strength limited due to pain and swelling to left knee.  Mild flattening of right nasolabial fold more pronounced at rest,.  Speech fluent, but per family still slightly slurred. Psychiatric: Normal judgment and insight. Alert and oriented x 3. Normal mood.   Labs on Admission: I have personally reviewed following labs and imaging studies  CBC: Recent Labs  Lab 06/18/22 1740  WBC 13.5*  NEUTROABS 10.9*  HGB 15.9  HCT 46.2  MCV 90.1  PLT 329   Basic Metabolic Panel: Recent Labs  Lab 06/18/22 1740  NA 145  K 3.3*  CL 105  CO2 26  GLUCOSE 121*  BUN 22  CREATININE 1.53*  CALCIUM  9.3   GFR: Estimated Creatinine Clearance: 37.7 mL/min (A) (by C-G formula based on SCr of 1.53 mg/dL (H)). Liver Function Tests: Recent Labs  Lab 06/18/22 1740  AST 192*  ALT 72*  ALKPHOS 62  BILITOT 2.4*  PROT 7.1  ALBUMIN 4.2    Coagulation Profile: Recent Labs  Lab 06/18/22 1904  INR 1.2   Cardiac Enzymes: Recent Labs  Lab 06/18/22 1740  CKTOTAL 7,958*   Radiological Exams on Admission: CT Cervical Spine Wo Contrast  Result Date: 06/18/2022 CLINICAL DATA:  Fall with trauma to the head and neck. EXAM: CT CERVICAL SPINE WITHOUT CONTRAST TECHNIQUE: Multidetector CT imaging of the cervical spine was performed without intravenous contrast. Multiplanar  CT image reconstructions were also generated. RADIATION DOSE REDUCTION: This exam was performed according to the departmental dose-optimization program which includes automated exposure control, adjustment of the mA and/or kV according to patient size and/or use of iterative reconstruction technique. COMPARISON:  None Available. FINDINGS: Alignment: No malalignment. Skull base and vertebrae: No regional fracture. Chronic anterior osteophytosis from C2-C7. Soft tissues and spinal canal: No traumatic soft tissue finding. Disc levels: No bony encroachment upon the canal or foramina. No facet arthropathy. Upper chest: Not included. Other: None IMPRESSION: No acute or traumatic finding. Chronic anterior osteophytosis from C2-C7. Wide patency of the canal and foramina. Electronically Signed   By: Nelson Chimes M.D.   On: 06/18/2022 18:23   DG Knee Complete 4 Views Left  Result Date: 06/18/2022 CLINICAL DATA:  Fall, knee pain EXAM: LEFT KNEE - COMPLETE 4+ VIEW COMPARISON:  Report from left knee radiographs dated 07/12/2021 FINDINGS: Mild to moderate articular space narrowing in the medial compartment with mild marginal spurring. There is also a marginal spurring in the patellofemoral joint. Mild prepatellar subcutaneous edema. Moderate left  knee joint effusion in the suprapatellar bursa. No fracture is identified. IMPRESSION: 1. Moderate left knee joint effusion in the suprapatellar bursa. 2. Mild-to-moderate osteoarthritis. 3. Mild prepatellar subcutaneous edema. Electronically Signed   By: Van Clines M.D.   On: 06/18/2022 18:22   CT Head Wo Contrast  Result Date: 06/18/2022 CLINICAL DATA:  [place].  Head trauma. EXAM: CT HEAD WITHOUT CONTRAST TECHNIQUE: Contiguous axial images were obtained from the base of the skull through the vertex without intravenous contrast. RADIATION DOSE REDUCTION: This exam was performed according to the departmental dose-optimization program which includes automated exposure control, adjustment of the mA and/or kV according to patient size and/or use of iterative reconstruction technique. COMPARISON:  None Available. FINDINGS: Brain: Acute subdural hematoma on the right. Largest component is along the lateral convexity measuring up to 11 mm in thickness. Smaller component along the falx measuring 3 mm in thickness. No intraparenchymal or subarachnoid bleeding. Mild mass effect with right-to-left shift of 3 mm. No evidence of pre-existing brain infarction. No hydrocephalus. There appears to be a small chronic subdural in the anterior cranial fossa on the left measuring 4 mm in thickness, much more localized. Vascular: There is atherosclerotic calcification of the major vessels at the base of the brain. Skull: No skull fracture. Sinuses/Orbits: Clear/normal Other: None IMPRESSION: Acute subdural hematoma on the right. Largest component along the lateral convexity measuring up to 11 mm in thickness. Smaller component along the falx measuring 3 mm in thickness. Mild mass effect with right-to-left shift of 3 mm. Small chronic subdural hematoma in the anterior cranial fossa on the left measuring only 4 mm. Critical Value/emergent results were called by telephone at the time of interpretation on 06/18/2022 at 6:19 pm to  provider Garnette Gunner , who verbally acknowledged these results. Electronically Signed   By: Nelson Chimes M.D.   On: 06/18/2022 18:21   DG Knee Complete 4 Views Right  Result Date: 06/18/2022 CLINICAL DATA:  Fall, knee pain EXAM: RIGHT KNEE - COMPLETE 4+ VIEW COMPARISON:  07/12/2021 report and images from 09/19/2012 FINDINGS: Mild medial compartmental articular space narrowing. Prepatellar soft tissue swelling likely from infiltrative edema, a component of prepatellar bursitis is not excluded. Indistinctness of the spurring and calcifications at the quadriceps attachment site, questionable expansion of the distal quadriceps which could be from tendinopathy. Partial fragmentation of the quadriceps spurring is age indeterminate. No significant additional bony findings. IMPRESSION:  1. Prepatellar soft tissue swelling likely from infiltrative edema, a component of prepatellar bursitis is not excluded. 2. Partial fragmentation of the quadriceps enthesophyte at the attachment site, with questionable expansion of the distal quadriceps. This could be from tendinopathy. Partial fragmentation of the quadriceps spurring is age indeterminate. Electronically Signed   By: Van Clines M.D.   On: 06/18/2022 18:20   DG Elbow Complete Left  Result Date: 06/18/2022 CLINICAL DATA:  Fall, elbow pain EXAM: LEFT ELBOW - COMPLETE 3+ VIEW COMPARISON:  None Available. FINDINGS: No fracture or joint effusion identified. Spurring at the triceps attachment to the olecranon. Potentially some mild subcutaneous edema posterior to the distal humerus. IMPRESSION: 1. No fracture or joint effusion identified. 2. Potential mild subcutaneous edema posterior to the distal humerus. Electronically Signed   By: Van Clines M.D.   On: 06/18/2022 18:17   DG Shoulder Left  Result Date: 06/18/2022 CLINICAL DATA:  Status post fall. EXAM: LEFT SHOULDER - 2+ VIEW COMPARISON:  None Available. FINDINGS: There is no evidence of fracture  or dislocation. Moderate severity degenerative changes are seen involving the left acromioclavicular joint and left glenohumeral articulation. Soft tissues are unremarkable. IMPRESSION: Moderate severity degenerative changes without evidence of acute osseous abnormality. Electronically Signed   By: Virgina Norfolk M.D.   On: 06/18/2022 18:15    EKG: Independently reviewed.  Sinus tachycardia, LAFB.  Rate 100.  QTc 452.  No significant change from prior.  Assessment/Plan Principal Problem:   Subdural hematoma (HCC) Active Problems:   Fall at home, initial encounter   Rhabdomyolysis   Right knee pain   Prostate cancer ([place])   Hypokalemia   Elevated troponin   HTN (hypertension)   Assessment and Plan: * Subdural hematoma (HCC) Status post mechanical fall.  CT findings of- Acute subdural hematoma on the right. Largest component along the lateral convexity measuring up to 11 mm in thickness. Smaller component along the falx measuring 3 mm in thickness. Mild mass effect with right-to-left shift of 3 mm. - EDP consulted neurosurgery - Dr. Glenford Peers, recommended admission to [facility] for evaluation. -Repeat head CT tonight, as from initial CT -Care order instruction to page neurosurgery on arrival to [place] n.p.o.  Fall at home, initial encounter Sustaining subdural hematoma and laceration to left forehead.  Likely mechanical fall from chronic left knee pain and swelling.  Family also reports slurred speech and right facial droop.  Need to rule out stroke also.  X-rays of left elbow, left and right knee and left shoulder negative for fractures, cervical spine. -MRI brain without contrast - Forehead Laceration sutured in ED by EDP  Rhabdomyolysis CK 7958.  Likely due to fall and prolonged immobility -1.5 L LR given, continue LR at 100cc/hr x 20 hrs -Trend CK  Right knee pain Chronic knee pain with swelling.  Bruising from fall present.  But no erythema differential warmth or  suggestion of infection.  WBC 13.5 likely stress reaction.   - Left knee x-ray shows moderate left knee joint effusion.   HTN (hypertension) Stable. -Hold HCTZ while n.p.o. -As needed labetalol for systolic greater than 161  Elevated troponin Troponin 321.  EKG without changes.  He denies chest pain.  Elevated troponin may be related to intracranial event. -Trend troponin for now  Hypokalemia Mild.  Potassium 3.3.  He is on HCTZ.  Prostate cancer [facility]) Follows with urologist Dr. Chrystine Oiler in [place].  On leuprolide injections every 3 months.   DVT prophylaxis: SCDS Code Status: Full  Family Communication: Niece Rlena at bedside and her husband.  But nephew Gerald Stabs, not present is HCPOA. Disposition Plan: ~ 2 days Consults called: Neurosurgery Admission status: Inpt Stepdown I certify that at the point of admission it is my clinical judgment that the patient will require inpatient hospital care spanning beyond 2 midnights from the point of admission due to high intensity of service, high risk for further deterioration and high frequency of surveillance required.    Author: Bethena Roys, MD 06/18/2022 10:07 PM  For on call review [email or web address].

## 2022-06-18 NOTE — Assessment & Plan Note (Addendum)
Follows with urologist Dr. Chrystine Oiler in [place].  On leuprolide injections every 3 months.

## 2022-06-18 NOTE — Assessment & Plan Note (Signed)
Mild.  Potassium 3.3.  He is on HCTZ.

## 2022-06-18 NOTE — ED Notes (Signed)
Forehead laceration cleaned.

## 2022-06-18 NOTE — ED Notes (Signed)
Patient transported to CT 

## 2022-06-18 NOTE — Assessment & Plan Note (Addendum)
Status post mechanical fall.  CT findings of- Acute subdural hematoma on the right. Largest component along the lateral convexity measuring up to 11 mm in thickness. Smaller component along the falx measuring 3 mm in thickness. Mild mass effect with right-to-left shift of 3 mm. - EDP consulted neurosurgery - Dr. Glenford Peers, recommended admission to [facility] for evaluation. -Repeat head CT tonight, as from initial CT -Care order instruction to page neurosurgery on arrival to [place] n.p.o.

## 2022-06-18 NOTE — Assessment & Plan Note (Signed)
Troponin 321.  EKG without changes.  He denies chest pain.  Elevated troponin may be related to intracranial event. -Trend troponin for now

## 2022-06-18 NOTE — Assessment & Plan Note (Signed)
Chronic knee pain with swelling.  Bruising from fall present.  But no erythema differential warmth or suggestion of infection.  WBC 13.5 likely stress reaction.   - Left knee x-ray shows moderate left knee joint effusion.

## 2022-06-18 NOTE — ED Provider Notes (Signed)
[facility] EMERGENCY DEPARTMENT Provider Note  CSN: [id number] Arrival date & time: 06/18/22 1650  Chief Complaint(s) Fall  HPI NAZAIRE CORDIAL is a 88 y.o. male with history of hypertension, hyperlipidemia, prostate cancer presenting to the emergency department after fall.  Patient reports that he turned around yesterday and fell.  He was against the wall and was unable to get up.  His niece was driving by the house today and went to check on him and could not get in the house because he had fallen against the door.  They were able to get the door open, then they brought him to the emergency department.  The patient reports that he has pain in both knees, left shoulder and elbow.  Otherwise denies any pain.  Denies chest pain, palpitations.  Denies losing consciousness.  Denies abdominal pain, nausea, vomiting.  Family noticed that he has a new slurred speech and some right-sided facial droop which seems new from last time they saw him a day or 2 ago.   Past Medical History Past Medical History:  Diagnosis Date  . Gout   . Hyperlipemia   . Hypertension   . Prostate cancer ([place])   . Thyroid disease    Patient Active Problem List   Diagnosis Date Noted  . Subdural hematoma ([place]) 06/18/2022  . Prostate cancer ([place]) 06/18/2022  . Fall at home, initial encounter 06/18/2022  . Rhabdomyolysis 06/18/2022  . Hypokalemia 06/18/2022  . Right knee pain 06/18/2022  . Elevated troponin 06/18/2022  . HTN (hypertension) 06/18/2022   Home Medication(s) Prior to Admission medications   Medication Sig Start Date End Date Taking? Authorizing Provider  atorvastatin (LIPITOR) 10 MG tablet Take 1 tablet by mouth daily.   Yes [provider]  Cholecalciferol (VITAMIN D3) 125 MCG (5000 UT) CAPS Take 1 capsule by mouth daily.   Yes [provider]  colchicine 0.6 MG tablet Take 0.6 mg by mouth daily as needed (gout flare-ups).   Yes [provider]  diclofenac Sodium  (VOLTAREN) 1 % GEL Apply 2 g topically 4 (four) times daily. 03/18/22  Yes [provider]  hydrochlorothiazide (HYDRODIURIL) 25 MG tablet Take 25 mg by mouth daily.   Yes [provider]  Leuprolide Acetate, 3 Month, (ELIGARD) 22.5 MG injection Inject 22.5 mg into the skin every 3 (three) months.   Yes [provider]  levothyroxine (SYNTHROID) 50 MCG tablet Take 50 mcg by mouth daily. 05/23/22  Yes [provider]  Multiple Vitamin (MULTIVITAMIN) tablet Take 1 tablet by mouth daily.   Yes [provider]  OVER THE COUNTER MEDICATION Blue emu   Yes [provider]  MYRBETRIQ 50 MG TB24 tablet  03/05/22   [provider]  pravastatin (PRAVACHOL) 20 MG tablet Take 20 mg by mouth daily. Patient not taking: Reported on 06/18/2022 04/10/22   [provider]  predniSONE (DELTASONE) 20 MG tablet Take 40 mg by mouth daily. Patient not taking: Reported on 06/18/2022 05/18/22   [provider]  Past Surgical History History reviewed. No pertinent surgical history. Family History History reviewed. No pertinent family history.  Social History Social History   Tobacco Use  . Smoking status: Never  . Smokeless tobacco: Never  Substance Use Topics  . Alcohol use: No  . Drug use: Never   Allergies Patient has no known allergies.  Review of Systems Review of Systems  All other systems reviewed and are negative.   Physical Exam Vital Signs  I have reviewed the triage vital signs BP 134/67   Pulse 86   Temp 98.2 F (36.8 C) (Oral)   Resp (!) 25   Ht '5\' 9"'$  (1.753 m)   Wt 86.2 kg   SpO2 100%   BMI 28.06 kg/m  Physical Exam Vitals and nursing note reviewed.  Constitutional:      General: He is not in acute distress.    Appearance: Normal appearance.  HENT:     Head:     Comments:  Laceration superior to the left eye.    Mouth/Throat:     Mouth: Mucous membranes are moist.  Eyes:     Extraocular Movements: Extraocular movements intact.     Conjunctiva/sclera: Conjunctivae normal.     Pupils: Pupils are equal, round, and reactive to light.  Cardiovascular:     Rate and Rhythm: Normal rate and regular rhythm.  Pulmonary:     Effort: Pulmonary effort is normal. No respiratory distress.     Breath sounds: Normal breath sounds.  Abdominal:     General: Abdomen is flat.     Palpations: Abdomen is soft.     Tenderness: There is no abdominal tenderness.  Musculoskeletal:     Right lower leg: No edema.     Left lower leg: No edema.     Comments: No midline C, T, L-spine tenderness.  No chest wall tenderness or crepitus.  Full range of motion of the bilateral shoulders, elbows, wrists, hips, knees, ankles, does have tenderness over the left shoulder with some bruising, left elbow, and some bruising over both knees.  Skin:    General: Skin is warm and dry.     Capillary Refill: Capillary refill takes less than 2 seconds.  Neurological:     Mental Status: He is alert and oriented to person, place, and time. Mental status is at baseline.     Comments: Cranial nerves II through XII intact with the exception of right-sided facial droop with some dysarthria without clear aphasia, strength 5 out of 5 in the bilateral upper and lower extremities, no sensory deficit to light touch, no dysmetria on finger-nose-finger test   Psychiatric:        Mood and Affect: Mood normal.        Behavior: Behavior normal.     ED Results and Treatments Labs (all labs ordered are listed, but only abnormal results are displayed) Labs Reviewed  COMPREHENSIVE METABOLIC PANEL - Abnormal; Notable for the following components:      Result Value   Potassium 3.3 (*)    Glucose, Bld 121 (*)    Creatinine, Ser 1.53 (*)    AST 192 (*)    ALT 72 (*)    Total Bilirubin 2.4 (*)    GFR, Estimated 44  (*)    All other components within normal limits  CBC WITH DIFFERENTIAL/PLATELET - Abnormal; Notable for the following components:   WBC 13.5 (*)    Neutro Abs 10.9 (*)    Monocytes Absolute 1.3 (*)  All other components within normal limits  CK - Abnormal; Notable for the following components:   Total CK 7,958 (*)    All other components within normal limits  URINALYSIS, ROUTINE W REFLEX MICROSCOPIC - Abnormal; Notable for the following components:   APPearance HAZY (*)    Hgb urine dipstick MODERATE (*)    Ketones, ur 20 (*)    Protein, ur 100 (*)    Bacteria, UA RARE (*)    All other components within normal limits  TROPONIN I (HIGH SENSITIVITY) - Abnormal; Notable for the following components:   Troponin I (High Sensitivity) 321 (*)    All other components within normal limits  TROPONIN I (HIGH SENSITIVITY) - Abnormal; Notable for the following components:   Troponin I (High Sensitivity) 272 (*)    All other components within normal limits  PROTIME-INR                                                                                                                          Radiology CT Cervical Spine Wo Contrast  Result Date: 06/18/2022 CLINICAL DATA:  Fall with trauma to the head and neck. EXAM: CT CERVICAL SPINE WITHOUT CONTRAST TECHNIQUE: Multidetector CT imaging of the cervical spine was performed without intravenous contrast. Multiplanar CT image reconstructions were also generated. RADIATION DOSE REDUCTION: This exam was performed according to the departmental dose-optimization program which includes automated exposure control, adjustment of the mA and/or kV according to patient size and/or use of iterative reconstruction technique. COMPARISON:  None Available. FINDINGS: Alignment: No malalignment. Skull base and vertebrae: No regional fracture. Chronic anterior osteophytosis from C2-C7. Soft tissues and spinal canal: No traumatic soft tissue finding. Disc levels: No bony  encroachment upon the canal or foramina. No facet arthropathy. Upper chest: Not included. Other: None IMPRESSION: No acute or traumatic finding. Chronic anterior osteophytosis from C2-C7. Wide patency of the canal and foramina. Electronically Signed   By: Nelson Chimes M.D.   On: 06/18/2022 18:23   DG Knee Complete 4 Views Left  Result Date: 06/18/2022 CLINICAL DATA:  Fall, knee pain EXAM: LEFT KNEE - COMPLETE 4+ VIEW COMPARISON:  Report from left knee radiographs dated 07/12/2021 FINDINGS: Mild to moderate articular space narrowing in the medial compartment with mild marginal spurring. There is also a marginal spurring in the patellofemoral joint. Mild prepatellar subcutaneous edema. Moderate left knee joint effusion in the suprapatellar bursa. No fracture is identified. IMPRESSION: 1. Moderate left knee joint effusion in the suprapatellar bursa. 2. Mild-to-moderate osteoarthritis. 3. Mild prepatellar subcutaneous edema. Electronically Signed   By: Van Clines M.D.   On: 06/18/2022 18:22   CT Head Wo Contrast  Result Date: 06/18/2022 CLINICAL DATA:  [place].  Head trauma. EXAM: CT HEAD WITHOUT CONTRAST TECHNIQUE: Contiguous axial images were obtained from the base of the skull through the vertex without intravenous contrast. RADIATION DOSE REDUCTION: This exam was performed according to the departmental dose-optimization program which includes automated exposure control, adjustment of the  mA and/or kV according to patient size and/or use of iterative reconstruction technique. COMPARISON:  None Available. FINDINGS: Brain: Acute subdural hematoma on the right. Largest component is along the lateral convexity measuring up to 11 mm in thickness. Smaller component along the falx measuring 3 mm in thickness. No intraparenchymal or subarachnoid bleeding. Mild mass effect with right-to-left shift of 3 mm. No evidence of pre-existing brain infarction. No hydrocephalus. There appears to be a small chronic subdural  in the anterior cranial fossa on the left measuring 4 mm in thickness, much more localized. Vascular: There is atherosclerotic calcification of the major vessels at the base of the brain. Skull: No skull fracture. Sinuses/Orbits: Clear/normal Other: None IMPRESSION: Acute subdural hematoma on the right. Largest component along the lateral convexity measuring up to 11 mm in thickness. Smaller component along the falx measuring 3 mm in thickness. Mild mass effect with right-to-left shift of 3 mm. Small chronic subdural hematoma in the anterior cranial fossa on the left measuring only 4 mm. Critical Value/emergent results were called by telephone at the time of interpretation on 06/18/2022 at 6:19 pm to provider Garnette Gunner , who verbally acknowledged these results. Electronically Signed   By: Nelson Chimes M.D.   On: 06/18/2022 18:21   DG Knee Complete 4 Views Right  Result Date: 06/18/2022 CLINICAL DATA:  Fall, knee pain EXAM: RIGHT KNEE - COMPLETE 4+ VIEW COMPARISON:  07/12/2021 report and images from 09/19/2012 FINDINGS: Mild medial compartmental articular space narrowing. Prepatellar soft tissue swelling likely from infiltrative edema, a component of prepatellar bursitis is not excluded. Indistinctness of the spurring and calcifications at the quadriceps attachment site, questionable expansion of the distal quadriceps which could be from tendinopathy. Partial fragmentation of the quadriceps spurring is age indeterminate. No significant additional bony findings. IMPRESSION: 1. Prepatellar soft tissue swelling likely from infiltrative edema, a component of prepatellar bursitis is not excluded. 2. Partial fragmentation of the quadriceps enthesophyte at the attachment site, with questionable expansion of the distal quadriceps. This could be from tendinopathy. Partial fragmentation of the quadriceps spurring is age indeterminate. Electronically Signed   By: Van Clines M.D.   On: 06/18/2022 18:20   DG  Elbow Complete Left  Result Date: 06/18/2022 CLINICAL DATA:  Fall, elbow pain EXAM: LEFT ELBOW - COMPLETE 3+ VIEW COMPARISON:  None Available. FINDINGS: No fracture or joint effusion identified. Spurring at the triceps attachment to the olecranon. Potentially some mild subcutaneous edema posterior to the distal humerus. IMPRESSION: 1. No fracture or joint effusion identified. 2. Potential mild subcutaneous edema posterior to the distal humerus. Electronically Signed   By: Van Clines M.D.   On: 06/18/2022 18:17   DG Shoulder Left  Result Date: 06/18/2022 CLINICAL DATA:  Status post fall. EXAM: LEFT SHOULDER - 2+ VIEW COMPARISON:  None Available. FINDINGS: There is no evidence of fracture or dislocation. Moderate severity degenerative changes are seen involving the left acromioclavicular joint and left glenohumeral articulation. Soft tissues are unremarkable. IMPRESSION: Moderate severity degenerative changes without evidence of acute osseous abnormality. Electronically Signed   By: Virgina Norfolk M.D.   On: 06/18/2022 18:15    Pertinent labs & imaging results that were available during my care of the patient were reviewed by me and considered in my medical decision making (see MDM for details).  Medications Ordered in ED Medications  Tdap (BOOSTRIX) injection 0.5 mL (0.5 mLs Intramuscular Not Given 06/18/22 2026)  potassium chloride 10 mEq in 100 mL IVPB (10 mEq Intravenous New Bag/Given 06/18/22  2232)  lactated ringers infusion (has no administration in time range)  sodium chloride 0.9 % bolus 500 mL (0 mLs Intravenous Stopped 06/18/22 1925)  Tdap (BOOSTRIX) injection 0.5 mL (0.5 mLs Intramuscular Given 06/18/22 1920)  acetaminophen (TYLENOL) tablet 1,000 mg (1,000 mg Oral Given 06/18/22 1920)  lactated ringers bolus 1,000 mL (0 mLs Intravenous Stopped 06/18/22 2126)  lidocaine (PF) (XYLOCAINE) 1 % injection 10 mL (10 mLs Infiltration Given 06/18/22 2212)                                                                                                                                      Procedures .Critical Care  Performed by: Cristie Hem, MD Authorized by: Cristie Hem, MD   Critical care provider statement:    Critical care time (minutes):  30   Critical care time was exclusive of:  Separately billable procedures and treating other patients   Critical care was necessary to treat or prevent imminent or life-threatening deterioration of the following conditions:  CNS failure or compromise, dehydration and circulatory failure   Critical care was time spent personally by me on the following activities:  Development of treatment plan with patient or surrogate, discussions with consultants, evaluation of patient's response to treatment, examination of patient, ordering and review of laboratory studies, ordering and review of radiographic studies, ordering and performing treatments and interventions, pulse oximetry, re-evaluation of patient's condition, review of old charts and obtaining history from patient or surrogate   Care discussed with: admitting provider   .Marland KitchenLaceration Repair  Date/Time: 06/18/2022 11:27 PM  Performed by: Cristie Hem, MD Authorized by: Cristie Hem, MD   Consent:    Consent obtained:  Verbal   Consent given by:  Patient   Risks discussed:  Infection, need for additional repair, nerve damage, retained foreign body, poor cosmetic result, pain and poor wound healing   Alternatives discussed:  No treatment Universal protocol:    Patient identity confirmed:  Verbally with patient Anesthesia:    Anesthesia method:  Local infiltration   Local anesthetic:  Lidocaine 1% w/o epi Laceration details:    Location:  Face   Face location:  Forehead   Length (cm):  5 Pre-procedure details:    Preparation:  Patient was prepped and draped in usual sterile fashion Exploration:    Limited defect created (wound extended): no     Wound  exploration: wound explored through full range of motion and entire depth of wound visualized     Wound extent: no fascia violation noted, no foreign bodies/material noted and no underlying fracture noted   Treatment:    Area cleansed with:  Saline   Amount of cleaning:  Extensive   Irrigation solution:  Sterile saline   Visualized foreign bodies/material removed: no     Debridement:  None   Undermining:  None   Scar revision: no   Skin repair:  Repair method:  Sutures   Suture size:  5-0   Suture material:  Prolene   Suture technique:  Simple interrupted and horizontal mattress   Number of sutures:  6 Approximation:    Approximation:  Close Repair type:    Repair type:  Complex Post-procedure details:    Procedure completion:  Tolerated well, no immediate complications   (including critical care time)  Medical Decision Making / ED Course   MDM:  88 year old presenting after fall.  Patient reportedly fell yesterday evening around 730, was found by family this afternoon.  Patient does have some tenderness to the knees, left shoulder and elbow.  Will obtain x-rays.  We will also obtain CT head and neck given head trauma although patient denies headache.  He does have new neurologic deficits which could be concerning for stroke.  Low concern for large vessel occlusion stroke at this time, extremity strength appears equal and symmetric and he has no aphasia, just facial droop.  Will obtain CK given prolonged downtime.  Will update tetanus.  Will reassess.  Clinical Course as of 06/18/22 2330  Tue Jun 18, 2022  1853 Discussed with Weston Brass of Neurosurgery. Recommends admission to hospitality service and they will consult.  [WS]  2025 Labs notable for elevated CK suggestive of mild rhabdomyolysis.  Patient also with troponin of 321.  Paged cardiology, suspect possibly demand related. [WS]  2100 Discussed with hospitalist who will admit and help facilitate transfer to [facility] for  neurosurgery consult. Have not heard from cardiology. No chest pain currently.  [WS]    Clinical Course User Index [WS] Cristie Hem, MD     Additional history obtained: -Additional history obtained from family -External records from outside source obtained and reviewed including: Chart review including previous notes, labs, imaging, consultation notes   Lab Tests: -I ordered, reviewed, and interpreted labs.   The pertinent results include:   Labs Reviewed  COMPREHENSIVE METABOLIC PANEL - Abnormal; Notable for the following components:      Result Value   Potassium 3.3 (*)    Glucose, Bld 121 (*)    Creatinine, Ser 1.53 (*)    AST 192 (*)    ALT 72 (*)    Total Bilirubin 2.4 (*)    GFR, Estimated 44 (*)    All other components within normal limits  CBC WITH DIFFERENTIAL/PLATELET - Abnormal; Notable for the following components:   WBC 13.5 (*)    Neutro Abs 10.9 (*)    Monocytes Absolute 1.3 (*)    All other components within normal limits  CK - Abnormal; Notable for the following components:   Total CK 7,958 (*)    All other components within normal limits  URINALYSIS, ROUTINE W REFLEX MICROSCOPIC - Abnormal; Notable for the following components:   APPearance HAZY (*)    Hgb urine dipstick MODERATE (*)    Ketones, ur 20 (*)    Protein, ur 100 (*)    Bacteria, UA RARE (*)    All other components within normal limits  TROPONIN I (HIGH SENSITIVITY) - Abnormal; Notable for the following components:   Troponin I (High Sensitivity) 321 (*)    All other components within normal limits  TROPONIN I (HIGH SENSITIVITY) - Abnormal; Notable for the following components:   Troponin I (High Sensitivity) 272 (*)    All other components within normal limits  PROTIME-INR      EKG   EKG Interpretation  Date/Time:  Tuesday June 18 2022 17:16:31 EDT  Ventricular Rate:  100 PR Interval:  139 QRS Duration: 84 QT Interval:  350 QTC Calculation: 452 R Axis:   -47 Text  Interpretation: Sinus tachycardia Left anterior fascicular block Low voltage, precordial leads Consider anterior infarct Borderline repolarization abnormality Confirmed by Garnette Gunner [id number]) on 06/18/2022 7:12:57 PM         Imaging Studies ordered: I ordered imaging studies including CT head, neck, XR various extremities On my interpretation imaging demonstrates new acute subdural I independently visualized and interpreted imaging. I agree with the radiologist interpretation   Medicines ordered and prescription drug management: Meds ordered this encounter  Medications  . sodium chloride 0.9 % bolus 500 mL  . Tdap (BOOSTRIX) injection 0.5 mL  . acetaminophen (TYLENOL) tablet 1,000 mg  . lactated ringers bolus 1,000 mL  . lidocaine (PF) (XYLOCAINE) 1 % injection 10 mL  . Tdap (BOOSTRIX) injection 0.5 mL  . DISCONTD: potassium chloride SA (KLOR-CON M) CR tablet 40 mEq  . potassium chloride 10 mEq in 100 mL IVPB  . lactated ringers infusion    -I have reviewed the patients home medicines and have made adjustments as needed   Consultations Obtained: I requested consultation with the neurosurgeon,  and discussed lab and imaging findings as well as pertinent plan - they recommend: transfer to [facility] for further evaluation   Cardiac Monitoring: The patient was maintained on a cardiac monitor.  I personally viewed and interpreted the cardiac monitored which showed an underlying rhythm of: NSR  Social Determinants of Health:  Factors impacting patients care include: lives alone   Reevaluation: After the interventions noted above, I reevaluated the patient and found that they have improved  Co morbidities that complicate the patient evaluation . Past Medical History:  Diagnosis Date  . Gout   . Hyperlipemia   . Hypertension   . Prostate cancer ([place])   . Thyroid disease       Dispostion: Admit    Final Clinical Impression(s) / ED Diagnoses Final diagnoses:   Subdural hematoma ([place])  Non-traumatic rhabdomyolysis  Elevated troponin     This chart was dictated using voice recognition software.  Despite best efforts to proofread,  errors can occur which can change the documentation meaning.    Cristie Hem, MD 06/18/22 2330

## 2022-06-18 NOTE — Assessment & Plan Note (Signed)
Stable. -Hold HCTZ while n.p.o. -As needed labetalol for systolic greater than 391

## 2022-06-18 NOTE — ED Triage Notes (Signed)
Pt brought in for fall, per pt he fell last night around 730 pm, laceration above left eye, injury to both knees and left elbow, family found pt around 330 pm this afternoon.

## 2022-06-18 NOTE — Assessment & Plan Note (Addendum)
CK 7958.  Likely due to fall and prolonged immobility -1.5 L LR given, continue LR at 100cc/hr x 20 hrs -Trend CK

## 2022-06-18 NOTE — Assessment & Plan Note (Addendum)
Sustaining subdural hematoma and laceration to left forehead.  Likely mechanical fall from chronic left knee pain and swelling.  Family also reports slurred speech and right facial droop.  Need to rule out stroke also.  X-rays of left elbow, left and right knee and left shoulder negative for fractures, cervical spine. -MRI brain without contrast - Forehead Laceration sutured in ED by EDP

## 2022-06-19 ENCOUNTER — Inpatient Hospital Stay (HOSPITAL_COMMUNITY): Payer: Medicare HMO

## 2022-06-19 DIAGNOSIS — S065XAA Traumatic subdural hemorrhage with loss of consciousness status unknown, initial encounter: Secondary | ICD-10-CM | POA: Diagnosis not present

## 2022-06-19 LAB — CBC
HCT: 34.9 % — ABNORMAL LOW (ref 39.0–52.0)
Hemoglobin: 12.4 g/dL — ABNORMAL LOW (ref 13.0–17.0)
MCH: 32 pg (ref 26.0–34.0)
MCHC: 35.5 g/dL (ref 30.0–36.0)
MCV: 90.2 fL (ref 80.0–100.0)
Platelets: 189 10*3/uL (ref 150–400)
RBC: 3.87 MIL/uL — ABNORMAL LOW (ref 4.22–5.81)
RDW: 14.2 % (ref 11.5–15.5)
WBC: 8.9 10*3/uL (ref 4.0–10.5)
nRBC: 0 % (ref 0.0–0.2)

## 2022-06-19 LAB — BASIC METABOLIC PANEL
Anion gap: 10 (ref 5–15)
BUN: 19 mg/dL (ref 8–23)
CO2: 24 mmol/L (ref 22–32)
Calcium: 8 mg/dL — ABNORMAL LOW (ref 8.9–10.3)
Chloride: 107 mmol/L (ref 98–111)
Creatinine, Ser: 1.27 mg/dL — ABNORMAL HIGH (ref 0.61–1.24)
GFR, Estimated: 55 mL/min — ABNORMAL LOW (ref >60)
Glucose, Bld: 85 mg/dL (ref 70–99)
Potassium: 3 mmol/L — ABNORMAL LOW (ref 3.5–5.1)
Sodium: 141 mmol/L (ref 135–145)

## 2022-06-19 LAB — MRSA NEXT GEN BY PCR, NASAL: MRSA by PCR Next Gen: NOT DETECTED

## 2022-06-19 LAB — CK: Total CK: 3640 U/L — ABNORMAL HIGH (ref 49–397)

## 2022-06-19 MED ORDER — ONDANSETRON HCL 4 MG PO TABS: 4.0000 mg | ORAL_TABLET | Freq: Four times a day (QID) | ORAL | Status: DC | PRN

## 2022-06-19 MED ORDER — POLYETHYLENE GLYCOL 3350 17 G PO PACK: 17.0000 g | PACK | Freq: Every day | ORAL | Status: DC | PRN

## 2022-06-19 MED ORDER — ACETAMINOPHEN 650 MG RE SUPP: 650.0000 mg | Freq: Four times a day (QID) | RECTAL | Status: DC | PRN

## 2022-06-19 MED ORDER — INFLUENZA VAC A&B SA ADJ QUAD 0.5 ML IM PRSY
0.5000 mL | PREFILLED_SYRINGE | INTRAMUSCULAR | Status: AC
  Administered 2022-06-20: 0.5 mL via INTRAMUSCULAR
  Filled 2022-06-19: qty 0.5

## 2022-06-19 MED ORDER — ONDANSETRON HCL 4 MG/2ML IJ SOLN: 4.0000 mg | Freq: Four times a day (QID) | INTRAMUSCULAR | Status: DC | PRN

## 2022-06-19 MED ORDER — ACETAMINOPHEN 325 MG PO TABS
650.0000 mg | ORAL_TABLET | Freq: Four times a day (QID) | ORAL | Status: DC | PRN
  Administered 2022-06-19 – 2022-06-20 (×3): 650 mg via ORAL
  Filled 2022-06-19 (×3): qty 2

## 2022-06-19 MED ORDER — LABETALOL HCL 5 MG/ML IV SOLN: 10.0000 mg | INTRAVENOUS | Status: DC | PRN

## 2022-06-19 MED ORDER — POTASSIUM CHLORIDE 10 MEQ/100ML IV SOLN
10.0000 meq | INTRAVENOUS | Status: AC
  Administered 2022-06-19 (×4): 10 meq via INTRAVENOUS
  Filled 2022-06-19 (×4): qty 100

## 2022-06-19 MED ORDER — LORAZEPAM 2 MG/ML IJ SOLN: 0.5000 mg | Freq: Once | INTRAMUSCULAR | Status: DC

## 2022-06-19 NOTE — Progress Notes (Signed)
Patient ID: CHEY CHO, male   DOB: 1934-03-27, 88 y.o.   MRN: [id number]   Attempted to evaluate patient, however, he was out of his room obtaining his MRI. Will check back latter.   Marvis Moeller, DNP, AGNP-C Neurosurgery Nurse Practitioner  [facility] [place] [address], [place],  [address] P: [phone]    F: [phone]  06/19/2022 8:41 AM

## 2022-06-19 NOTE — Consult Note (Signed)
Reason for Consult:Fall with SDH Referring Physician: Cristie Hem, MD   HPI: Grant Martinez is an 88 y.o. male with a PmHx significant for HTN, HLD, gout, thyroid disease and prostate cancer who was brought to the ED after he was found down on the floor by his niece yesterday afternoon. Patient remembers walking through his room when he turned around and then fell, striking his head. He denies any LOC. He reports chronic knee pain over the past several months for which he has been managing with injections. Due to the knee pain, he is not able to pick his left foot off of the ground and subsequently drags the left foot with ambulation. Per family, the patient was noted to have mild dysarthria and right-sided facial asymmetry. He was brought to the ED for evaluation which ultimately revealed an acute SDH. Neurosurgery consultation was requested due to finding of a SDH.   Past Medical History:  Diagnosis Date   Gout    Hyperlipemia    Hypertension    Prostate cancer ([place])    Thyroid disease     History reviewed. No pertinent surgical history.  History reviewed. No pertinent family history.  Social History:  reports that he has never smoked. He has never used smokeless tobacco. He reports that he does not drink alcohol and does not use drugs.  Allergies: No Known Allergies  Medications: I have reviewed the patient's current medications.  Results for orders placed or performed during the hospital encounter of 06/18/22 (from the past 48 hour(s))  Comprehensive metabolic panel     Status: Abnormal   Collection Time: 06/18/22  5:40 PM  Result Value Ref Range   Sodium 145 135 - 145 mmol/L   Potassium 3.3 (L) 3.5 - 5.1 mmol/L   Chloride 105 98 - 111 mmol/L   CO2 26 22 - 32 mmol/L   Glucose, Bld 121 (H) 70 - 99 mg/dL    Comment: Glucose reference range applies only to samples taken after fasting for at least 8 hours.   BUN 22 8 - 23 mg/dL   Creatinine, Ser 1.53 (H) 0.61 - 1.24 mg/dL    Calcium 9.3 8.9 - 10.3 mg/dL   Total Protein 7.1 6.5 - 8.1 g/dL   Albumin 4.2 3.5 - 5.0 g/dL   AST 192 (H) 15 - 41 U/L   ALT 72 (H) 0 - 44 U/L   Alkaline Phosphatase 62 38 - 126 U/L   Total Bilirubin 2.4 (H) 0.3 - 1.2 mg/dL   GFR, Estimated 44 (L) >60 mL/min    Comment: (NOTE) Calculated using the CKD-EPI Creatinine Equation (2021)    Anion gap 14 5 - 15    Comment: Performed at [facility], [address] [place],  [address]  CBC with Differential     Status: Abnormal   Collection Time: 06/18/22  5:40 PM  Result Value Ref Range   WBC 13.5 (H) 4.0 - 10.5 K/uL   RBC 5.13 4.22 - 5.81 MIL/uL   Hemoglobin 15.9 13.0 - 17.0 g/dL   HCT 46.2 39.0 - 52.0 %   MCV 90.1 80.0 - 100.0 fL   MCH 31.0 26.0 - 34.0 pg   MCHC 34.4 30.0 - 36.0 g/dL   RDW 14.1 11.5 - 15.5 %   Platelets 265 150 - 400 K/uL   nRBC 0.0 0.0 - 0.2 %   Neutrophils Relative % 81 %   Neutro Abs 10.9 (H) 1.7 - 7.7 K/uL   Lymphocytes Relative 7 %  Lymphs Abs 0.9 0.7 - 4.0 K/uL   Monocytes Relative 9 %   Monocytes Absolute 1.3 (H) 0.1 - 1.0 K/uL   Eosinophils Relative 3 %   Eosinophils Absolute 0.4 0.0 - 0.5 K/uL   Basophils Relative 0 %   Basophils Absolute 0.1 0.0 - 0.1 K/uL   Immature Granulocytes 0 %   Abs Immature Granulocytes 0.05 0.00 - 0.07 K/uL    Comment: Performed at [facility], [address], [place], [place] [address]  Troponin I (High Sensitivity)     Status: Abnormal   Collection Time: 06/18/22  5:40 PM  Result Value Ref Range   Troponin I (High Sensitivity) 321 (HH) <18 ng/L    Comment: CRITICAL RESULT CALLED TO, READ BACK BY AND VERIFIED WITH: LONG,L ON 06/18/22 AT 1905 BY LOY,C (NOTE) Elevated high sensitivity troponin I (hsTnI) values and significant  changes across serial measurements may suggest ACS but many other  chronic and acute conditions are known to elevate hsTnI results.  Refer to the Links section for chest pain algorithms and additional  guidance. Performed at [facility], [address], [place], [place] [address]   CK     Status: Abnormal   Collection Time: 06/18/22  5:40 PM  Result Value Ref Range   Total CK 7,958 (H) 49 - 397 U/L    Comment: RESULTS CONFIRMED BY MANUAL DILUTION Performed at [facility], [address], [place], [place] [address]   Protime-INR     Status: None   Collection Time: 06/18/22  7:04 PM  Result Value Ref Range   Prothrombin Time 14.8 11.4 - 15.2 seconds   INR 1.2 0.8 - 1.2    Comment: (NOTE) INR goal varies based on device and disease states. Performed at [facility], [address], [place], [place] [address]   Troponin I (High Sensitivity)     Status: Abnormal   Collection Time: 06/18/22  9:47 PM  Result Value Ref Range   Troponin I (High Sensitivity) 272 (HH) <18 ng/L    Comment: DELTA CHECK NOTED CRITICAL RESULT CALLED TO, READ BACK BY AND VERIFIED WITH: BRAME.M ON 06/18/22 AT 2225 BY LOY,C (NOTE) Elevated high sensitivity troponin I (hsTnI) values and significant  changes across serial measurements may suggest ACS but many other  chronic and acute conditions are known to elevate hsTnI results.  Refer to the Links section for chest pain algorithms and additional  guidance. Performed at [facility], [address] [place], [place] [address]   Urinalysis, Routine w reflex microscopic Urine, Clean Catch     Status: Abnormal   Collection Time: 06/18/22 10:31 PM  Result Value Ref Range   Color, Urine YELLOW YELLOW   APPearance HAZY (A) CLEAR   Specific Gravity, Urine 1.027 1.005 - 1.030   pH 5.0 5.0 - 8.0   Glucose, UA NEGATIVE NEGATIVE mg/dL   Hgb urine dipstick MODERATE (A) NEGATIVE   Bilirubin Urine NEGATIVE NEGATIVE   Ketones, ur 20 (A) NEGATIVE mg/dL   Protein, ur 100 (A) NEGATIVE mg/dL   Nitrite NEGATIVE NEGATIVE   Leukocytes,Ua NEGATIVE NEGATIVE   RBC / HPF 0-5 0 - 5 RBC/hpf   WBC, UA 0-5 0 - 5 WBC/hpf   Bacteria, UA RARE (A) NONE SEEN   Squamous Epithelial / LPF 0-5 0 - 5   Mucus PRESENT     Hyaline Casts, UA PRESENT     Comment: Performed at [facility], [address] [place], [place] [address]  MRSA Next Gen  by PCR, Nasal     Status: None   Collection Time: 06/19/22  1:41 AM   Specimen: Nasal Mucosa; Nasal Swab  Result Value Ref Range   MRSA by PCR Next Gen NOT DETECTED NOT DETECTED    Comment: (NOTE) The GeneXpert MRSA Assay (FDA approved for NASAL specimens only), is one component of a comprehensive MRSA colonization surveillance program. It is not intended to diagnose MRSA infection nor to guide or monitor treatment for MRSA infections. Test performance is not FDA approved in patients less than 28 years old. Performed at [facility], [place] [address] [place], [place] [address]   Basic metabolic panel     Status: Abnormal   Collection Time: 06/19/22  5:48 AM  Result Value Ref Range   Sodium 141 135 - 145 mmol/L   Potassium 3.0 (L) 3.5 - 5.1 mmol/L   Chloride 107 98 - 111 mmol/L   CO2 24 22 - 32 mmol/L   Glucose, Bld 85 70 - 99 mg/dL    Comment: Glucose reference range applies only to samples taken after fasting for at least 8 hours.   BUN 19 8 - 23 mg/dL   Creatinine, Ser 1.27 (H) 0.61 - 1.24 mg/dL   Calcium 8.0 (L) 8.9 - 10.3 mg/dL   GFR, Estimated 55 (L) >60 mL/min    Comment: (NOTE) Calculated using the CKD-EPI Creatinine Equation (2021)    Anion gap 10 5 - 15    Comment: Performed at [facility] [address] [place], [place] [address]  CBC     Status: Abnormal   Collection Time: 06/19/22  5:48 AM  Result Value Ref Range   WBC 8.9 4.0 - 10.5 K/uL   RBC 3.87 (L) 4.22 - 5.81 MIL/uL   Hemoglobin 12.4 (L) 13.0 - 17.0 g/dL   HCT 34.9 (L) 39.0 - 52.0 %   MCV 90.2 80.0 - 100.0 fL   MCH 32.0 26.0 - 34.0 pg   MCHC 35.5 30.0 - 36.0 g/dL   RDW 14.2 11.5 - 15.5 %   Platelets 189 150 - 400 K/uL   nRBC 0.0 0.0 - 0.2 %    Comment: Performed at [facility], [place] [address] [place], [place] [address]  CK     Status: Abnormal   Collection  Time: 06/19/22  5:48 AM  Result Value Ref Range   Total CK 3,640 (H) 49 - 397 U/L    Comment: Performed at [facility], [place] [address], [place], [place] [address]    MR BRAIN WO CONTRAST  Result Date: 06/19/2022 CLINICAL DATA:  Slurred speech, right facial droop, fall. Subdural hematoma. EXAM: MRI HEAD WITHOUT CONTRAST TECHNIQUE: Multiplanar, multiecho pulse sequences of the brain and surrounding structures were obtained without intravenous contrast. COMPARISON:  Same-day noncontrast head CT FINDINGS: Brain: Again seen is an acute right cerebral convexity subdural hematoma measuring up to 9 mm in thickness over the frontal lobe with additional blood products layering along the falx. There is a smaller subdural collection over the left cerebral convexity measuring up to 3 mm in thickness without definite acute blood products on the same-day CT. The right-sided collection results in mild mass effect with mild sulcal effacement and 3 mm leftward midline shift, unchanged. There is probable small volume subarachnoid hemorrhage overlying the left parietal lobe. There is no evidence of acute territorial infarct. Background parenchymal volume is normal for age. The ventricles are normal in size. Gray-white differentiation is preserved. Parenchymal  signal is normal for age with no significant burden of underlying white matter disease. There is no solid mass lesion. Vascular: Normal flow voids. Skull and upper cervical spine: Normal marrow signal. Sinuses/Orbits: The paranasal sinuses are clear. The globes and orbits are unremarkable. Other: None. IMPRESSION: 1. Acute subdural blood over the right cerebral convexity and layering along the falx measuring up to 9 mm in thickness with mild mass effect and 3 mm leftward midline shift, unchanged. 2. Smaller left cerebral convexity subdural collection measuring up to 3 mm without definite acute blood products on the same-day CT. 3. Probable small volume subarachnoid  hemorrhage over the left parietal lobe. 4. No evidence of acute infarct. Electronically Signed   By: Valetta Mole M.D.   On: 06/19/2022 08:58   CT HEAD WO CONTRAST (5MM)  Result Date: 06/19/2022 CLINICAL DATA:  Subdural hematoma follow-up EXAM: CT HEAD WITHOUT CONTRAST TECHNIQUE: Contiguous axial images were obtained from the base of the skull through the vertex without intravenous contrast. RADIATION DOSE REDUCTION: This exam was performed according to the departmental dose-optimization program which includes automated exposure control, adjustment of the mA and/or kV according to patient size and/or use of iterative reconstruction technique. COMPARISON:  None Available. FINDINGS: Brain: Unchanged right convexity subdural hematoma, 10 mm thick. Component along the falx cerebri is unchanged. No new site of hemorrhage. Mild mass effect is unchanged. Vascular: Atherosclerotic calcification of the internal carotid arteries at the skull base. No abnormal hyperdensity of the major intracranial arteries or dural venous sinuses. Skull: The visualized skull base, calvarium and extracranial soft tissues are normal. Sinuses/Orbits: No fluid levels or advanced mucosal thickening of the visualized paranasal sinuses. No mastoid or middle ear effusion. The orbits are normal. IMPRESSION: 1. Unchanged right convexity subdural hematoma, 10 mm thick, with mild mass effect. 2. No new site of hemorrhage. Electronically Signed   By: Ulyses Jarred M.D.   On: 06/19/2022 00:38   CT Cervical Spine Wo Contrast  Result Date: 06/18/2022 CLINICAL DATA:  Fall with trauma to the head and neck. EXAM: CT CERVICAL SPINE WITHOUT CONTRAST TECHNIQUE: Multidetector CT imaging of the cervical spine was performed without intravenous contrast. Multiplanar CT image reconstructions were also generated. RADIATION DOSE REDUCTION: This exam was performed according to the departmental dose-optimization program which includes automated exposure control,  adjustment of the mA and/or kV according to patient size and/or use of iterative reconstruction technique. COMPARISON:  None Available. FINDINGS: Alignment: No malalignment. Skull base and vertebrae: No regional fracture. Chronic anterior osteophytosis from C2-C7. Soft tissues and spinal canal: No traumatic soft tissue finding. Disc levels: No bony encroachment upon the canal or foramina. No facet arthropathy. Upper chest: Not included. Other: None IMPRESSION: No acute or traumatic finding. Chronic anterior osteophytosis from C2-C7. Wide patency of the canal and foramina. Electronically Signed   By: Nelson Chimes M.D.   On: 06/18/2022 18:23   DG Knee Complete 4 Views Left  Result Date: 06/18/2022 CLINICAL DATA:  Fall, knee pain EXAM: LEFT KNEE - COMPLETE 4+ VIEW COMPARISON:  Report from left knee radiographs dated 07/12/2021 FINDINGS: Mild to moderate articular space narrowing in the medial compartment with mild marginal spurring. There is also a marginal spurring in the patellofemoral joint. Mild prepatellar subcutaneous edema. Moderate left knee joint effusion in the suprapatellar bursa. No fracture is identified. IMPRESSION: 1. Moderate left knee joint effusion in the suprapatellar bursa. 2. Mild-to-moderate osteoarthritis. 3. Mild prepatellar subcutaneous edema. Electronically Signed   By: Cindra Eves.D.  On: 06/18/2022 18:22   CT Head Wo Contrast  Result Date: 06/18/2022 CLINICAL DATA:  [place].  Head trauma. EXAM: CT HEAD WITHOUT CONTRAST TECHNIQUE: Contiguous axial images were obtained from the base of the skull through the vertex without intravenous contrast. RADIATION DOSE REDUCTION: This exam was performed according to the departmental dose-optimization program which includes automated exposure control, adjustment of the mA and/or kV according to patient size and/or use of iterative reconstruction technique. COMPARISON:  None Available. FINDINGS: Brain: Acute subdural hematoma on the right.  Largest component is along the lateral convexity measuring up to 11 mm in thickness. Smaller component along the falx measuring 3 mm in thickness. No intraparenchymal or subarachnoid bleeding. Mild mass effect with right-to-left shift of 3 mm. No evidence of pre-existing brain infarction. No hydrocephalus. There appears to be a small chronic subdural in the anterior cranial fossa on the left measuring 4 mm in thickness, much more localized. Vascular: There is atherosclerotic calcification of the major vessels at the base of the brain. Skull: No skull fracture. Sinuses/Orbits: Clear/normal Other: None IMPRESSION: Acute subdural hematoma on the right. Largest component along the lateral convexity measuring up to 11 mm in thickness. Smaller component along the falx measuring 3 mm in thickness. Mild mass effect with right-to-left shift of 3 mm. Small chronic subdural hematoma in the anterior cranial fossa on the left measuring only 4 mm. Critical Value/emergent results were called by telephone at the time of interpretation on 06/18/2022 at 6:19 pm to provider Garnette Gunner , who verbally acknowledged these results. Electronically Signed   By: Nelson Chimes M.D.   On: 06/18/2022 18:21   DG Knee Complete 4 Views Right  Result Date: 06/18/2022 CLINICAL DATA:  Fall, knee pain EXAM: RIGHT KNEE - COMPLETE 4+ VIEW COMPARISON:  07/12/2021 report and images from 09/19/2012 FINDINGS: Mild medial compartmental articular space narrowing. Prepatellar soft tissue swelling likely from infiltrative edema, a component of prepatellar bursitis is not excluded. Indistinctness of the spurring and calcifications at the quadriceps attachment site, questionable expansion of the distal quadriceps which could be from tendinopathy. Partial fragmentation of the quadriceps spurring is age indeterminate. No significant additional bony findings. IMPRESSION: 1. Prepatellar soft tissue swelling likely from infiltrative edema, a component of  prepatellar bursitis is not excluded. 2. Partial fragmentation of the quadriceps enthesophyte at the attachment site, with questionable expansion of the distal quadriceps. This could be from tendinopathy. Partial fragmentation of the quadriceps spurring is age indeterminate. Electronically Signed   By: Van Clines M.D.   On: 06/18/2022 18:20   DG Elbow Complete Left  Result Date: 06/18/2022 CLINICAL DATA:  Fall, elbow pain EXAM: LEFT ELBOW - COMPLETE 3+ VIEW COMPARISON:  None Available. FINDINGS: No fracture or joint effusion identified. Spurring at the triceps attachment to the olecranon. Potentially some mild subcutaneous edema posterior to the distal humerus. IMPRESSION: 1. No fracture or joint effusion identified. 2. Potential mild subcutaneous edema posterior to the distal humerus. Electronically Signed   By: Van Clines M.D.   On: 06/18/2022 18:17   DG Shoulder Left  Result Date: 06/18/2022 CLINICAL DATA:  Status post fall. EXAM: LEFT SHOULDER - 2+ VIEW COMPARISON:  None Available. FINDINGS: There is no evidence of fracture or dislocation. Moderate severity degenerative changes are seen involving the left acromioclavicular joint and left glenohumeral articulation. Soft tissues are unremarkable. IMPRESSION: Moderate severity degenerative changes without evidence of acute osseous abnormality. Electronically Signed   By: Virgina Norfolk M.D.   On: 06/18/2022 18:15  ROS: Per HPI Blood pressure (!) 144/84, pulse 93, temperature 99.1 F (37.3 C), temperature source Oral, resp. rate 17, height '5\' 9"'$  (1.753 m), weight 89.1 kg, SpO2 96 %. Physical Exam: Patient is awake, A/O X 4, conversant, and in good spirits. Eyes open spontaneously. They are in NAD and VSS. Doing well. Speech is fluent and appropriate. Mildly dysarthria. MAEW with good strength. Sensation to light touch is intact. PERLA, EOMI. CNs grossly intact except for 7th CN, mild right facial asymmetry most prominent along the  right nasolabial fold.     Assessment/Plan: 88 y.o. male who was brought to the ED after being found down following a fall. CT imaging was reviewed and revealed an acute SDH along the right lateral convexity measuring up to 1.1 cm with approximately 3 mm of right-to-left MLS. There is also a small chronic SDH in the anterior cranial fossa on the left measuring 4 mm. He was transferred to [facility] for continued evaluation and close neurological monitoring. Repeat CT head this morning revealed stable appearance of the right convexity SDH without any areas of new hemorrhage. No acute neurosurgical intervention is indicated. Patient can follow up as an outpatient with repeat CT head in 2 weeks. Keppra 500 mg BID X 7 days.   Marvis Moeller 06/19/2022, 2:38 PM

## 2022-06-19 NOTE — Progress Notes (Signed)
  Transition of Care [facility]) Screening Note   Patient Details  Name: Grant Martinez Date of Birth: 04-14-1934   Transition of Care [facility]) CM/SW Contact:    Pollie Friar, RN Phone Number: 06/19/2022, 3:56 PM   Pt is from home alone. Awaiting therapy evals. Transition of Care Department [facility]) has reviewed patient. We will continue to monitor patient advancement through interdisciplinary progression rounds. If new patient transition needs arise, please place a TOC consult.

## 2022-06-19 NOTE — Progress Notes (Signed)
PROGRESS NOTE    Grant Martinez  PNT:[id number] DOB: 07-Jul-1934 DOA: 06/18/2022 PCP: Curlene Labrum, MD   Brief Narrative:  Grant Martinez is a 88 y.o. male with medical history significant for hypertension, gout, prostate cancer.  Patient was brought to the ED after he was found on the floor by his niece this afternoon. CT head-shows acute subdural hematoma on the right, largest complaint along the lateral convexity measuring up to 11 mm in thickness.  Smaller component measuring 3 mm. Mild mass effect.  Also small chronic subdural hematoma.  Neurosurgery consulted, Dr. Glenford Peers made aware, admit to [facility];   Assessment & Plan:   Principal Problem:   Subdural hematoma ([place]) Active Problems:   Fall at home, initial encounter   Rhabdomyolysis   Right knee pain   Prostate cancer ([place])   Hypokalemia   Elevated troponin   HTN (hypertension)   Subdural hematoma ([place]) - Status post presumed mechanical fall.   - CT findings of- Acute subdural hematoma on the right. Largest component along the lateral convexity measuring up to 11 mm in thickness. Smaller component along the falx measuring 3 mm in thickness. Mild mass effect with right-to-left shift of 3 mm. -Neurosurgery following, appreciate insight and recommendations -repeat CT head stable, no acute neurosurgical intervention indicated.     Fall at home, initial encounter Sustaining subdural hematoma and laceration to left forehead.  Likely mechanical fall from chronic left knee pain and swelling.  Family also reports slurred speech and right facial droop.  Need to rule out stroke also.  X-rays of left elbow, left and right knee and left shoulder negative for fractures, cervical spine. -MRI brain without contrast - Forehead Laceration sutured in ED by EDP   Rhabdomyolysis AKI presumed, unclear baseline Profound dehydration in the setting of prolonged downtime CK 7958.  Likely due to fall and prolonged immobility -1.5  L LR given, continue LR at 100cc/hr x 20 hrs -Trend CK   Right knee pain - Acute on chronic secondary to recent fall given above  - Left knee x-ray shows moderate left knee joint effusion.   HTN (hypertension) -Hold, blood pressure currently well controlled   Elevated troponin Troponin 321.  EKG without changes.  He denies chest pain.  Elevated troponin may be related to intracranial event. -Trend troponin for now   Hypokalemia Ongoing, likely complicated by HCTZ and dehydration   Prostate cancer [facility]) Follows with urologist Dr. Chrystine Oiler in [place].  On leuprolide injections every 3 months.  DVT prophylaxis: Holding given above Code Status: Full Family Communication: At bedside  Status is: Inpatient  Dispo: The patient is from: Home               Anticipated d/c is to: Home              Anticipated d/c date is: 24 to 48 hours              Patient currently not medically stable for discharge  Consultants:  Neurosurgery  Procedures:  None  Antimicrobials:  None  Subjective: No acute issues or events overnight  Objective: Vitals:   06/18/22 1930 06/18/22 2000 06/18/22 2230 06/19/22 0107  BP: 116/84 (!) 118/58 134/67 119/63  Pulse: 86 92 86 84  Resp: 17 18 (!) 25 19  Temp:    98.4 F (36.9 C)  TempSrc:    Oral  SpO2: 96% 100% 100% 99%  Weight:    89.1 kg  Height:  $'5\' 9"'Z$  (1.753 m)    Intake/Output Summary (Last 24 hours) at 06/19/2022 0801 Last data filed at 06/19/2022 0600 Gross per 24 hour  Intake 1717.02 ml  Output 125 ml  Net 1592.02 ml   Filed Weights   06/18/22 1658 06/19/22 0107  Weight: 86.2 kg 89.1 kg    Examination:  General:  Pleasantly resting in bed, No acute distress. HEENT:  Normocephalic atraumatic.  Sclerae nonicteric, noninjected.  Extraocular movements intact bilaterally. Neck:  Without mass or deformity.  Trachea is midline. Lungs:  Clear to auscultate bilaterally without rhonchi, wheeze, or rales. Heart:  Regular rate and  rhythm.  Without murmurs, rubs, or gallops. Abdomen:  Soft, nontender, nondistended.  Without guarding or rebound. Extremities: Without cyanosis, clubbing, edema, or obvious deformity. Vascular:  Dorsalis pedis and posterior tibial pulses palpable bilaterally. Skin: Traumatic ecchymoses and skin tear over left anterior forehead left elbow and bilateral knees  Data Reviewed: I have personally reviewed following labs and imaging studies  CBC: Recent Labs  Lab 06/18/22 1740 06/19/22 0548  WBC 13.5* 8.9  NEUTROABS 10.9*  --   HGB 15.9 12.4*  HCT 46.2 34.9*  MCV 90.1 90.2  PLT 265 314   Basic Metabolic Panel: Recent Labs  Lab 06/18/22 1740 06/19/22 0548  NA 145 141  K 3.3* 3.0*  CL 105 107  CO2 26 24  GLUCOSE 121* 85  BUN 22 19  CREATININE 1.53* 1.27*  CALCIUM 9.3 8.0*   GFR: Estimated Creatinine Clearance: 46.1 mL/min (A) (by C-G formula based on SCr of 1.27 mg/dL (H)). Liver Function Tests: Recent Labs  Lab 06/18/22 1740  AST 192*  ALT 72*  ALKPHOS 62  BILITOT 2.4*  PROT 7.1  ALBUMIN 4.2   No results for input(s): "LIPASE", "AMYLASE" in the last 168 hours. No results for input(s): "AMMONIA" in the last 168 hours. Coagulation Profile: Recent Labs  Lab 06/18/22 1904  INR 1.2   Cardiac Enzymes: Recent Labs  Lab 06/18/22 1740 06/19/22 0548  CKTOTAL 7,958* 3,640*   BNP (last 3 results) No results for input(s): "PROBNP" in the last 8760 hours. HbA1C: No results for input(s): "HGBA1C" in the last 72 hours. CBG: No results for input(s): "GLUCAP" in the last 168 hours. Lipid Profile: No results for input(s): "CHOL", "HDL", "LDLCALC", "TRIG", "CHOLHDL", "LDLDIRECT" in the last 72 hours. Thyroid Function Tests: No results for input(s): "TSH", "T4TOTAL", "FREET4", "T3FREE", "THYROIDAB" in the last 72 hours. Anemia Panel: No results for input(s): "VITAMINB12", "FOLATE", "FERRITIN", "TIBC", "IRON", "RETICCTPCT" in the last 72 hours. Sepsis Labs: No results  for input(s): "PROCALCITON", "LATICACIDVEN" in the last 168 hours.  Recent Results (from the past 240 hour(s))  MRSA Next Gen by PCR, Nasal     Status: None   Collection Time: 06/19/22  1:41 AM   Specimen: Nasal Mucosa; Nasal Swab  Result Value Ref Range Status   MRSA by PCR Next Gen NOT DETECTED NOT DETECTED Final    Comment: (NOTE) The GeneXpert MRSA Assay (FDA approved for NASAL specimens only), is one component of a comprehensive MRSA colonization surveillance program. It is not intended to diagnose MRSA infection nor to guide or monitor treatment for MRSA infections. Test performance is not FDA approved in patients less than 87 years old. Performed at [facility], [place] [address] [place], [place] [address]          Radiology Studies: CT HEAD WO CONTRAST (5MM)  Result Date: 06/19/2022 CLINICAL DATA:  Subdural hematoma follow-up EXAM: CT HEAD  WITHOUT CONTRAST TECHNIQUE: Contiguous axial images were obtained from the base of the skull through the vertex without intravenous contrast. RADIATION DOSE REDUCTION: This exam was performed according to the departmental dose-optimization program which includes automated exposure control, adjustment of the mA and/or kV according to patient size and/or use of iterative reconstruction technique. COMPARISON:  None Available. FINDINGS: Brain: Unchanged right convexity subdural hematoma, 10 mm thick. Component along the falx cerebri is unchanged. No new site of hemorrhage. Mild mass effect is unchanged. Vascular: Atherosclerotic calcification of the internal carotid arteries at the skull base. No abnormal hyperdensity of the major intracranial arteries or dural venous sinuses. Skull: The visualized skull base, calvarium and extracranial soft tissues are normal. Sinuses/Orbits: No fluid levels or advanced mucosal thickening of the visualized paranasal sinuses. No mastoid or middle ear effusion. The orbits are normal. IMPRESSION: 1. Unchanged right  convexity subdural hematoma, 10 mm thick, with mild mass effect. 2. No new site of hemorrhage. Electronically Signed   By: Ulyses Jarred M.D.   On: 06/19/2022 00:38   CT Cervical Spine Wo Contrast  Result Date: 06/18/2022 CLINICAL DATA:  Fall with trauma to the head and neck. EXAM: CT CERVICAL SPINE WITHOUT CONTRAST TECHNIQUE: Multidetector CT imaging of the cervical spine was performed without intravenous contrast. Multiplanar CT image reconstructions were also generated. RADIATION DOSE REDUCTION: This exam was performed according to the departmental dose-optimization program which includes automated exposure control, adjustment of the mA and/or kV according to patient size and/or use of iterative reconstruction technique. COMPARISON:  None Available. FINDINGS: Alignment: No malalignment. Skull base and vertebrae: No regional fracture. Chronic anterior osteophytosis from C2-C7. Soft tissues and spinal canal: No traumatic soft tissue finding. Disc levels: No bony encroachment upon the canal or foramina. No facet arthropathy. Upper chest: Not included. Other: None IMPRESSION: No acute or traumatic finding. Chronic anterior osteophytosis from C2-C7. Wide patency of the canal and foramina. Electronically Signed   By: Nelson Chimes M.D.   On: 06/18/2022 18:23   DG Knee Complete 4 Views Left  Result Date: 06/18/2022 CLINICAL DATA:  Fall, knee pain EXAM: LEFT KNEE - COMPLETE 4+ VIEW COMPARISON:  Report from left knee radiographs dated 07/12/2021 FINDINGS: Mild to moderate articular space narrowing in the medial compartment with mild marginal spurring. There is also a marginal spurring in the patellofemoral joint. Mild prepatellar subcutaneous edema. Moderate left knee joint effusion in the suprapatellar bursa. No fracture is identified. IMPRESSION: 1. Moderate left knee joint effusion in the suprapatellar bursa. 2. Mild-to-moderate osteoarthritis. 3. Mild prepatellar subcutaneous edema. Electronically Signed   By:  Van Clines M.D.   On: 06/18/2022 18:22   CT Head Wo Contrast  Result Date: 06/18/2022 CLINICAL DATA:  [place].  Head trauma. EXAM: CT HEAD WITHOUT CONTRAST TECHNIQUE: Contiguous axial images were obtained from the base of the skull through the vertex without intravenous contrast. RADIATION DOSE REDUCTION: This exam was performed according to the departmental dose-optimization program which includes automated exposure control, adjustment of the mA and/or kV according to patient size and/or use of iterative reconstruction technique. COMPARISON:  None Available. FINDINGS: Brain: Acute subdural hematoma on the right. Largest component is along the lateral convexity measuring up to 11 mm in thickness. Smaller component along the falx measuring 3 mm in thickness. No intraparenchymal or subarachnoid bleeding. Mild mass effect with right-to-left shift of 3 mm. No evidence of pre-existing brain infarction. No hydrocephalus. There appears to be a small chronic subdural in the anterior cranial fossa on the left  measuring 4 mm in thickness, much more localized. Vascular: There is atherosclerotic calcification of the major vessels at the base of the brain. Skull: No skull fracture. Sinuses/Orbits: Clear/normal Other: None IMPRESSION: Acute subdural hematoma on the right. Largest component along the lateral convexity measuring up to 11 mm in thickness. Smaller component along the falx measuring 3 mm in thickness. Mild mass effect with right-to-left shift of 3 mm. Small chronic subdural hematoma in the anterior cranial fossa on the left measuring only 4 mm. Critical Value/emergent results were called by telephone at the time of interpretation on 06/18/2022 at 6:19 pm to provider Garnette Gunner , who verbally acknowledged these results. Electronically Signed   By: Nelson Chimes M.D.   On: 06/18/2022 18:21   DG Knee Complete 4 Views Right  Result Date: 06/18/2022 CLINICAL DATA:  Fall, knee pain EXAM: RIGHT KNEE -  COMPLETE 4+ VIEW COMPARISON:  07/12/2021 report and images from 09/19/2012 FINDINGS: Mild medial compartmental articular space narrowing. Prepatellar soft tissue swelling likely from infiltrative edema, a component of prepatellar bursitis is not excluded. Indistinctness of the spurring and calcifications at the quadriceps attachment site, questionable expansion of the distal quadriceps which could be from tendinopathy. Partial fragmentation of the quadriceps spurring is age indeterminate. No significant additional bony findings. IMPRESSION: 1. Prepatellar soft tissue swelling likely from infiltrative edema, a component of prepatellar bursitis is not excluded. 2. Partial fragmentation of the quadriceps enthesophyte at the attachment site, with questionable expansion of the distal quadriceps. This could be from tendinopathy. Partial fragmentation of the quadriceps spurring is age indeterminate. Electronically Signed   By: Van Clines M.D.   On: 06/18/2022 18:20   DG Elbow Complete Left  Result Date: 06/18/2022 CLINICAL DATA:  Fall, elbow pain EXAM: LEFT ELBOW - COMPLETE 3+ VIEW COMPARISON:  None Available. FINDINGS: No fracture or joint effusion identified. Spurring at the triceps attachment to the olecranon. Potentially some mild subcutaneous edema posterior to the distal humerus. IMPRESSION: 1. No fracture or joint effusion identified. 2. Potential mild subcutaneous edema posterior to the distal humerus. Electronically Signed   By: Van Clines M.D.   On: 06/18/2022 18:17   DG Shoulder Left  Result Date: 06/18/2022 CLINICAL DATA:  Status post fall. EXAM: LEFT SHOULDER - 2+ VIEW COMPARISON:  None Available. FINDINGS: There is no evidence of fracture or dislocation. Moderate severity degenerative changes are seen involving the left acromioclavicular joint and left glenohumeral articulation. Soft tissues are unremarkable. IMPRESSION: Moderate severity degenerative changes without evidence of acute  osseous abnormality. Electronically Signed   By: Virgina Norfolk M.D.   On: 06/18/2022 18:15    Scheduled Meds:  [START ON 06/20/2022] influenza vaccine adjuvanted  0.5 mL Intramuscular Tomorrow-1000   LORazepam  0.5 mg Intravenous Once   Tdap  0.5 mL Intramuscular Once   Continuous Infusions:  lactated ringers 100 mL/hr at 06/19/22 0153     LOS: 1 day   Time spent: 66mn  Kiyra Slaubaugh C Nadya Hopwood, DO [facility]  If 7PM-7AM, please contact night-coverage [email or web address]  06/19/2022, 8:01 AM

## 2022-06-19 NOTE — Evaluation (Signed)
Physical Therapy Evaluation Patient Details Name: Grant Martinez MRN: [id number] DOB: 1934/07/06 Today's Date: 06/19/2022  History of Present Illness  88 yo male [facility] with fall, slurred speech, R facial droop. CT C-spine negative for fx. MRI on 10/4 shows Acute subdural blood over the right cerebral convexity and layering along the falx measuring up to 9 mm in thickness with mild mass effect and 3 mm leftward midline shift, smaller left cerebral convexity subdural collection measuring up to 3 mm without definite acute blood products, probable small volume SAH over L parietal lobe. R knee XR shows prepatellar soft tissue swelling, partial fragmentation of quadriceps enthesophyte at the attachment site, could be tendinopathy. L knee XR shows Moderate left knee joint effusion in the suprapatellar bursa. PMH includes hypertension, hyperlipidemia, prostate cancer.  Clinical Impression   Pt presents with generalized weakness L>R, impaired balance with recent history of falls, suspected impaired cognition vs baseline, and decreased activity tolerance. Pt to benefit from acute PT to address deficits. Pt ambulated hallway distance with use of RW and steadying assist. Pt's niece present during session, states she can stay with pt at d/c. PT to progress mobility as tolerated, and will continue to follow acutely.         Recommendations for follow up therapy are one component of a multi-disciplinary discharge planning process, led by the attending physician.  Recommendations may be updated based on patient status, additional functional criteria and insurance authorization.  Follow Up Recommendations Home health PT      Assistance Recommended at Discharge Frequent or constant Supervision/Assistance  Patient can return home with the following  A little help with walking and/or transfers;A little help with bathing/dressing/bathroom;Assistance with cooking/housework;Help with stairs or ramp  for entrance    Equipment Recommendations Rolling walker (2 wheels);BSC/3in1  Recommendations for Other Services       Functional Status Assessment Patient has had a recent decline in their functional status and demonstrates the ability to make significant improvements in function in a reasonable and predictable amount of time.     Precautions / Restrictions Precautions Precautions: Fall Precaution Comments: Reports L eye blurring Restrictions Weight Bearing Restrictions: No      Mobility  Bed Mobility Overal bed mobility: Needs Assistance Bed Mobility: Supine to Sit     Supine to sit: Min assist     General bed mobility comments: assist for trunk elevation off of bed, progression LEs to EOB    Transfers Overall transfer level: Needs assistance Equipment used: Rolling walker (2 wheels) Transfers: Sit to/from Stand Sit to Stand: Min assist, From elevated surface           General transfer comment: assist to steady upon standing    Ambulation/Gait Ambulation/Gait assistance: Min guard, Min assist Gait Distance (Feet): 160 Feet Assistive device: Rolling walker (2 wheels) Gait Pattern/deviations: Step-through pattern, Decreased stride length, Drifts right/left Gait velocity: decr     General Gait Details: occasional light steadying assist, correcting R drift in hallway  Stairs            Wheelchair Mobility    Modified Rankin (Stroke Patients Only)       Balance Overall balance assessment: Needs assistance, History of Falls Sitting-balance support: No upper extremity supported, Feet supported Sitting balance-Leahy Scale: Fair     Standing balance support: Bilateral upper extremity supported, During functional activity Standing balance-Leahy Scale: Poor Standing balance comment: reliant on support presently  Pertinent Vitals/Pain Pain Assessment Pain Assessment: Faces Faces Pain Scale: Hurts even  more Pain Location: "all over", specifically L knee Pain Descriptors / Indicators: Sore, Discomfort Pain Intervention(s): Limited activity within patient's tolerance, Monitored during session, Repositioned    Home Living Family/patient expects to be discharged to:: Private residence Living Arrangements: Alone Available Help at Discharge: Family;Available PRN/intermittently Type of Home: House Home Access: Stairs to enter   [employer] of Steps: 2   Home Layout: One level Home Equipment: Shower seat Additional Comments: has equipment he can borrow: RW, quad cane, w/c    Prior Function Prior Level of Function : Driving;Independent/Modified Independent             Mobility Comments: pt does not use AD, reports history of falls but this is the first "bad one" ADLs Comments: Pt reports independence with ADLs, pt's niece lives down the road and assists with housework     Hand Dominance   Dominant Hand: Right    Extremity/Trunk Assessment   Upper Extremity Assessment Upper Extremity Assessment: Defer to OT evaluation    Lower Extremity Assessment Lower Extremity Assessment: Generalized weakness;LLE deficits/detail LLE Deficits / Details: 2+/5 knee extension and hip flexion, 3+/5 hip abd/add, at least 3/5 DF/PF    Cervical / Trunk Assessment Cervical / Trunk Assessment: Normal  Communication   Communication: No difficulties  Cognition Arousal/Alertness: Awake/alert Behavior During Therapy: WFL for tasks assessed/performed, Flat affect Overall Cognitive Status: Impaired/Different from baseline Area of Impairment: Orientation, Memory, Following commands, Safety/judgement, Problem solving                 Orientation Level: Disoriented to, Situation   Memory: Decreased recall of precautions Following Commands: Follows one step commands with increased time Safety/Judgement: Decreased awareness of safety, Decreased awareness of deficits   Problem  Solving: Slow processing, Decreased initiation, Difficulty sequencing General Comments: Pt with very flat affect, improving with continued session. Pt does not know why he is in the hospital, but once oriented to fall pt recalls details of fall and injuries sustained        General Comments General comments (skin integrity, edema, etc.): smooth pursuits, horiz and vert saccades WFL other than increased time (suspect due to slow processing)    Exercises     Assessment/Plan    PT Assessment Patient needs continued PT services  PT Problem List Decreased strength;Decreased mobility;Decreased activity tolerance;Decreased balance;Decreased knowledge of use of DME;Pain;Decreased safety awareness;Decreased knowledge of precautions       PT Treatment Interventions DME instruction;Therapeutic activities;Gait training;Therapeutic exercise;Patient/family education;Balance training;Stair training;Functional mobility training;Neuromuscular re-education    PT Goals (Current goals can be found in the Care Plan section)  Acute Rehab PT Goals Patient Stated Goal: home PT Goal Formulation: With patient Time For Goal Achievement: 07/03/22 Potential to Achieve Goals: Good    Frequency Min 4X/week     Co-evaluation               AM-PAC PT "6 Clicks" Mobility  Outcome Measure Help needed turning from your back to your side while in a flat bed without using bedrails?: A Little Help needed moving from lying on your back to sitting on the side of a flat bed without using bedrails?: A Little Help needed moving to and from a bed to a chair (including a wheelchair)?: A Little Help needed standing up from a chair using your arms (e.g., wheelchair or bedside chair)?: A Little Help needed to walk in hospital room?: A Little Help needed climbing  3-5 steps with a railing? : A Lot 6 Click Score: 17    End of Session Equipment Utilized During Treatment: Gait belt Activity Tolerance: Patient tolerated  treatment well;Patient limited by fatigue Patient left: in chair;with call bell/phone within reach;with chair alarm set;with family/visitor present Nurse Communication: Mobility status PT Visit Diagnosis: Other abnormalities of gait and mobility (R26.89);Muscle weakness (generalized) (M62.81)    Time: 1440-1510 PT Time Calculation (min) (ACUTE ONLY): 30 min   Charges:   PT Evaluation $PT Eval Low Complexity: 1 Low PT Treatments $Gait Training: 8-22 mins       Stacie Glaze, PT DPT Acute Rehabilitation Services Pager [phone]  Office [phone]   [place] 06/19/2022, 4:16 PM

## 2022-06-19 NOTE — Progress Notes (Signed)
Pt arrived to unit via transport on stretcher, VSS, neurosurgery paged upon arrival.

## 2022-06-20 DIAGNOSIS — S065XAA Traumatic subdural hemorrhage with loss of consciousness status unknown, initial encounter: Secondary | ICD-10-CM | POA: Diagnosis not present

## 2022-06-20 LAB — CBC
HCT: 34.4 % — ABNORMAL LOW (ref 39.0–52.0)
Hemoglobin: 11.9 g/dL — ABNORMAL LOW (ref 13.0–17.0)
MCH: 32 pg (ref 26.0–34.0)
MCHC: 34.6 g/dL (ref 30.0–36.0)
MCV: 92.5 fL (ref 80.0–100.0)
Platelets: 179 10*3/uL (ref 150–400)
RBC: 3.72 MIL/uL — ABNORMAL LOW (ref 4.22–5.81)
RDW: 14.5 % (ref 11.5–15.5)
WBC: 7.2 10*3/uL (ref 4.0–10.5)
nRBC: 0 % (ref 0.0–0.2)

## 2022-06-20 LAB — BASIC METABOLIC PANEL
Anion gap: 7 (ref 5–15)
BUN: 16 mg/dL (ref 8–23)
CO2: 24 mmol/L (ref 22–32)
Calcium: 7.9 mg/dL — ABNORMAL LOW (ref 8.9–10.3)
Chloride: 109 mmol/L (ref 98–111)
Creatinine, Ser: 1.13 mg/dL (ref 0.61–1.24)
GFR, Estimated: >60 mL/min (ref >60)
Glucose, Bld: 84 mg/dL (ref 70–99)
Potassium: 3.4 mmol/L — ABNORMAL LOW (ref 3.5–5.1)
Sodium: 140 mmol/L (ref 135–145)

## 2022-06-20 LAB — CK
Total CK: 1220 U/L — ABNORMAL HIGH (ref 49–397)
Total CK: 1745 U/L — ABNORMAL HIGH (ref 49–397)

## 2022-06-20 MED ORDER — LEVETIRACETAM 500 MG PO TABS
500.0000 mg | ORAL_TABLET | Freq: Two times a day (BID) | ORAL | Status: DC
  Administered 2022-06-20 – 2022-06-21 (×3): 500 mg via ORAL
  Filled 2022-06-20 (×3): qty 1

## 2022-06-20 MED ORDER — DICLOFENAC SODIUM 1 % EX GEL
2.0000 g | CUTANEOUS | Status: DC | PRN
  Administered 2022-06-20 – 2022-06-21 (×2): 2 g via TOPICAL
  Filled 2022-06-20: qty 100

## 2022-06-20 MED ORDER — BACITRACIN-NEOMYCIN-POLYMYXIN OINTMENT TUBE
TOPICAL_OINTMENT | CUTANEOUS | Status: DC | PRN
  Filled 2022-06-20: qty 14

## 2022-06-20 NOTE — TOC Initial Note (Signed)
Transition of Care [facility]) - Initial/Assessment Note    Patient Details  Name: Grant Martinez MRN: [id number] Date of Birth: 09/21/1933  Transition of Care [facility]) CM/SW Contact:    Pollie Friar, RN Phone Number: 06/20/2022, 12:48 PM  Clinical Narrative:                 Pt lives at home alone. Niece is at the bedside and plans on staying with the patient after d/c from the hospital. Niece will oversee his medications and provide needed transportation.  Niece asked to use [facility] for [facility] but they are not in network with the patients insurance and dont have the staff to see him currently. Niece picked [place] [facility] and they accepted. Information on the AVS.  TOC following.   Expected Discharge Plan: [place] Barriers to Discharge: Continued Medical Work up   Patient Goals and CMS Choice   CMS Medicare.gov Compare Post Acute Care list provided to:: Patient Choice offered to / list presented to : Patient (niece)  Expected Discharge Plan and Services Expected Discharge Plan: [place]   Discharge Planning Services: CM Consult Post Acute Care Choice: Home Health, Durable Medical Equipment Living arrangements for the past 2 months: Single Family Home                 DME Arranged: Bedside commode, Walker rolling DME Agency: [employer] Date DME Agency Contacted: 06/20/22   Representative spoke with at DME Agency: Annie [facility]able HH Arranged: PT, OT [place] Agency: [place] Date [place]: 06/20/22   Representative spoke with at [place]: Tommi Rumps  Prior Living Arrangements/Services Living arrangements for the past 2 months: [place] Lives with:: Self Patient language and need for interpreter reviewed:: Yes Do you feel safe going back to the place where you live?: Yes      Need for Family Participation in Patient Care: Yes (Comment)     Criminal Activity/Legal Involvement Pertinent to Current Situation/Hospitalization: No -  Comment as needed  Activities of Daily Living Home Assistive Devices/Equipment: None ADL Screening (condition at time of admission) Patient's cognitive ability adequate to safely complete daily activities?: Yes Is the patient deaf or have difficulty hearing?: Yes Does the patient have difficulty seeing, even when wearing glasses/contacts?: No Does the patient have difficulty concentrating, remembering, or making decisions?: No Patient able to express need for assistance with ADLs?: Yes Does the patient have difficulty dressing or bathing?: Yes Independently performs ADLs?: Yes (appropriate for developmental age) Does the patient have difficulty walking or climbing stairs?: Yes Weakness of Legs: Left Weakness of Arms/Hands: None  Permission Sought/Granted                  Emotional Assessment Appearance:: Appears stated age Attitude/Demeanor/Rapport: Engaged Affect (typically observed): Accepting Orientation: : Oriented to Self, Oriented to Place, Oriented to  Time, Oriented to Situation   Psych Involvement: No (comment)  Admission diagnosis:  Subdural hematoma ([place]) [S06.5XAA] Elevated troponin [R79.89] Non-traumatic rhabdomyolysis [M62.82] Patient Active Problem List   Diagnosis Date Noted   Subdural hematoma ([place]) 06/18/2022   Prostate cancer ([place]) 06/18/2022   Fall at home, initial encounter 06/18/2022   Rhabdomyolysis 06/18/2022   Hypokalemia 06/18/2022   Right knee pain 06/18/2022   Elevated troponin 06/18/2022   HTN (hypertension) 06/18/2022   PCP:  Curlene Labrum, MD Pharmacy:   [place], [place]  [place] Phone: [phone] Fax: [phone]     Social Determinants of Health (SDOH) Interventions    Readmission Risk Interventions     No data to display

## 2022-06-20 NOTE — Evaluation (Signed)
Occupational Therapy Evaluation Patient Details Name: Grant Martinez MRN: [id number] DOB: December 10, 1933 Today's Date: 06/20/2022   History of Present Illness 88 yo male presents to [facility] on 10/3 with fall, slurred speech, R facial droop. CT C-spine negative for fx. MRI on 10/4 shows Acute subdural blood over the right cerebral convexity and layering along the falx measuring up to 9 mm in thickness with mild mass effect and 3 mm leftward midline shift, smaller left cerebral convexity subdural collection measuring up to 3 mm without definite acute blood products, probable small volume SAH over L parietal lobe. R knee XR shows prepatellar soft tissue swelling, partial fragmentation of quadriceps enthesophyte at the attachment site, could be tendinopathy. L knee XR shows Moderate left knee joint effusion in the suprapatellar bursa. PMH includes hypertension, hyperlipidemia, prostate cancer.   Clinical Impression   This 88 yo male admitted with above presents to acute OT with PLOF of living by himself with help only from niece for IADLs. Currently he is setup/S-min A for basic ADLs at a RW level. He will continue to benefit from acute OT with follow up [place] and 24/7 S/prn A from niece. We will continue to follow.     Recommendations for follow up therapy are one component of a multi-disciplinary discharge planning process, led by the attending physician.  Recommendations may be updated based on patient status, additional functional criteria and insurance authorization.   Follow Up Recommendations  Home health OT    Assistance Recommended at Discharge Frequent or constant Supervision/Assistance  Patient can return home with the following A little help with walking and/or transfers;A little help with bathing/dressing/bathroom;Help with stairs or ramp for entrance;Assistance with cooking/housework;Assist for transportation;Direct supervision/assist for financial management;Direct supervision/assist for  medications management    Functional Status Assessment  Patient has had a recent decline in their functional status and demonstrates the ability to make significant improvements in function in a reasonable and predictable amount of time.  Equipment Recommendations  BSC/3in1       Precautions / Restrictions Precautions Precautions: Fall Restrictions Weight Bearing Restrictions: No      Mobility Bed Mobility               General bed mobility comments: pt up in recliner upon arrival    Transfers Overall transfer level: Needs assistance Equipment used: Rolling walker (2 wheels) Transfers: Sit to/from Stand Sit to Stand: Min assist (from recliner)                  Balance Overall balance assessment: Needs assistance Sitting-balance support: No upper extremity supported, Feet supported Sitting balance-Leahy Scale: Fair     Standing balance support: Bilateral upper extremity supported, Reliant on assistive device for balance Standing balance-Leahy Scale: Poor                             ADL either performed or assessed with clinical judgement   ADL Overall ADL's : Needs assistance/impaired Eating/Feeding: Independent;Sitting   Grooming: Set up;Supervision/safety;Sitting   Upper Body Bathing: Set up;Supervision/ safety;Sitting   Lower Body Bathing: Minimal assistance;Sit to/from stand   Upper Body Dressing : Supervision/safety;Set up;Sitting   Lower Body Dressing: Minimal assistance;Sit to/from stand   Toilet Transfer: Minimal assistance;Rolling walker (2 wheels);Ambulation Toilet Transfer Details (indicate cue type and reason): simulated recliner>3 steps foreward and back to recliner. Toileting- Clothing Manipulation and Hygiene: Minimal assistance;Sit to/from stand  Pertinent Vitals/Pain Pain Assessment Pain Assessment: Faces Faces Pain Scale: Hurts even more Pain Location: both knees, but left wore  than right Pain Descriptors / Indicators: Sore, Discomfort Pain Intervention(s): Limited activity within patient's tolerance, Monitored during session, Repositioned     Hand Dominance Right   Extremity/Trunk Assessment Upper Extremity Assessment Upper Extremity Assessment: LUE deficits/detail LUE Deficits / Details: weak compared to RUE (dominant), especially grip, no drift with eyes closed           Communication Communication Communication: HOH   Cognition Arousal/Alertness: Awake/alert Behavior During Therapy: WFL for tasks assessed/performed, Flat affect   Area of Impairment: Following commands, Safety/judgement                       Following Commands: Follows one step commands with increased time, Follows one step commands inconsistently Safety/Judgement: Decreased awareness of safety     General Comments: pt HOH so following commands could be that hearing is the problem and not all cognition                Home Living Family/patient expects to be discharged to:: Private residence Living Arrangements: Alone Available Help at Discharge: Family;Available PRN/intermittently Type of Home: House Home Access: Stairs to enter [employer] of Steps: 2   Home Layout: One level     Bathroom Shower/Tub: Tub/shower unit         Home Equipment: Shower seat;Grab bars - tub/shower;Hand held shower head   Additional Comments: has equipment he can borrow: RW, quad cane, w/c. Sponge bathes because he cannot left his LLE enough to step into tub      Prior Functioning/Environment Prior Level of Function : Driving;Independent/Modified Independent             Mobility Comments: pt does not use AD, reports history of falls but this is the first "bad one" ADLs Comments: Pt reports independence with ADLs, pt's niece lives down the road and assists with housework; reports she will be staying with him all the time until she feels she can leave him         OT Problem List: Decreased strength;Decreased range of motion;Impaired balance (sitting and/or standing);Impaired UE functional use      OT Treatment/Interventions: Self-care/ADL training;Therapeutic exercise;DME and/or AE instruction;Patient/family education;Balance training    OT Goals(Current goals can be found in the care plan section) Acute Rehab OT Goals Patient Stated Goal: to go home OT Goal Formulation: With patient/family Time For Goal Achievement: 07/04/22 Potential to Achieve Goals: Good  OT Frequency: Min 2X/week       AM-PAC OT "6 Clicks" Daily Activity     Outcome Measure Help from another person eating meals?: None Help from another person taking care of personal grooming?: A Little Help from another person toileting, which includes using toliet, bedpan, or urinal?: A Little Help from another person bathing (including washing, rinsing, drying)?: A Little Help from another person to put on and taking off regular upper body clothing?: A Little Help from another person to put on and taking off regular lower body clothing?: A Little 6 Click Score: 19   End of Session Equipment Utilized During Treatment: Rolling walker (2 wheels)  Activity Tolerance: Patient tolerated treatment well Patient left: in chair;with call bell/phone within reach;with chair alarm set  OT Visit Diagnosis: Unsteadiness on feet (R26.81);Muscle weakness (generalized) (M62.81);Other symptoms and signs involving cognitive function  Time: 9379-0240 OT Time Calculation (min): 20 min Charges:  OT General Charges $OT Visit: 1 Visit OT Evaluation $OT Eval Moderate Complexity: [place], OTR/L Acute [employer] [facility] [phone] Office [phone]    Almon Register 06/20/2022, 1:53 PM

## 2022-06-20 NOTE — Progress Notes (Signed)
Physical Therapy Treatment Patient Details Name: LAEL PILCH MRN: [id number] DOB: 06/14/1934 Today's Date: 06/20/2022   History of Present Illness 88 yo male presents to [facility] on 10/3 with fall, slurred speech, R facial droop. CT C-spine negative for fx. MRI on 10/4 shows Acute subdural blood over the right cerebral convexity and layering along the falx measuring up to 9 mm in thickness with mild mass effect and 3 mm leftward midline shift, smaller left cerebral convexity subdural collection measuring up to 3 mm without definite acute blood products, probable small volume SAH over L parietal lobe. R knee XR shows prepatellar soft tissue swelling, partial fragmentation of quadriceps enthesophyte at the attachment site, could be tendinopathy. L knee XR shows Moderate left knee joint effusion in the suprapatellar bursa. PMH includes hypertension, hyperlipidemia, prostate cancer.    PT Comments    Pt progressing well with mobility, reports he feels better today vs yesterday as he is having less pain and feels stronger. Pt ambulatory in hallway with use of RW and close guard, PT trialled pt off the RW as pt does not use AD at baseline but pt too unsteady without AD at this time. Pt's niece present for session, will be helping pt at d/c. Will continue to follow.      Recommendations for follow up therapy are one component of a multi-disciplinary discharge planning process, led by the attending physician.  Recommendations may be updated based on patient status, additional functional criteria and insurance authorization.  Follow Up Recommendations  Home health PT     Assistance Recommended at Discharge Frequent or constant Supervision/Assistance  Patient can return home with the following A little help with walking and/or transfers;A little help with bathing/dressing/bathroom;Assistance with cooking/housework;Help with stairs or ramp for entrance   Equipment Recommendations  Rolling walker (2  wheels);BSC/3in1    Recommendations for Other Services       Precautions / Restrictions Precautions Precautions: Fall Precaution Comments: Reports L eye blurring Restrictions Weight Bearing Restrictions: No     Mobility  Bed Mobility Overal bed mobility: Needs Assistance             General bed mobility comments: pt up in recliner upon arrival    Transfers Overall transfer level: Needs assistance Equipment used: Rolling walker (2 wheels) Transfers: Sit to/from Stand Sit to Stand: Min assist           General transfer comment: rise and steady assist    Ambulation/Gait Ambulation/Gait assistance: Min guard Gait Distance (Feet): 280 Feet Assistive device: Rolling walker (2 wheels) Gait Pattern/deviations: Step-through pattern, Decreased stride length, Trunk flexed Gait velocity: decr     General Gait Details: cues for upright posture, placement in RW   Stairs Stairs: Yes Stairs assistance: Min guard Stair Management: One rail Right, Step to pattern, Forwards Number of Stairs: 3 General stair comments: close guard for safety, cues for rail use and taking his time   Wheelchair Mobility    Modified Rankin (Stroke Patients Only)       Balance Overall balance assessment: Needs assistance Sitting-balance support: No upper extremity supported, Feet supported Sitting balance-Leahy Scale: Fair     Standing balance support: Bilateral upper extremity supported, Reliant on assistive device for balance Standing balance-Leahy Scale: Poor Standing balance comment: reliant on support, briefly ambulatory x30 ft without UE support and pt unsteady and reaching for environment  Cognition Arousal/Alertness: Awake/alert Behavior During Therapy: WFL for tasks assessed/performed, Flat affect   Area of Impairment: Following commands, Safety/judgement                       Following Commands: Follows one step commands  with increased time Safety/Judgement: Decreased awareness of safety   Problem Solving: Requires verbal cues, Requires tactile cues          Exercises      General Comments        Pertinent Vitals/Pain Pain Assessment Pain Assessment: Faces Faces Pain Scale: Hurts even more Pain Location: bilat knees L>R Pain Descriptors / Indicators: Sore, Discomfort Pain Intervention(s): Limited activity within patient's tolerance, Monitored during session, Repositioned    Home Living Family/patient expects to be discharged to:: Private residence Living Arrangements: Alone Available Help at Discharge: Family;Available PRN/intermittently Type of Home: House Home Access: Stairs to enter   [employer] of Steps: 2   Home Layout: One level Home Equipment: Shower seat;Grab bars - tub/shower;Hand held shower head Additional Comments: has equipment he can borrow: RW, quad cane, w/c. Sponge bathes because he cannot left his LLE enough to step into tub    Prior Function            PT Goals (current goals can now be found in the care plan section) Acute Rehab PT Goals Patient Stated Goal: home PT Goal Formulation: With patient Time For Goal Achievement: 07/03/22 Potential to Achieve Goals: Good Progress towards PT goals: Progressing toward goals    Frequency    Min 4X/week      PT Plan Current plan remains appropriate    Co-evaluation              AM-PAC PT "6 Clicks" Mobility   Outcome Measure  Help needed turning from your back to your side while in a flat bed without using bedrails?: A Little Help needed moving from lying on your back to sitting on the side of a flat bed without using bedrails?: A Little Help needed moving to and from a bed to a chair (including a wheelchair)?: A Little Help needed standing up from a chair using your arms (e.g., wheelchair or bedside chair)?: A Little Help needed to walk in hospital room?: A Little Help needed climbing 3-5  steps with a railing? : A Little 6 Click Score: 18    End of Session   Activity Tolerance: Patient tolerated treatment well Patient left: in chair;with call bell/phone within reach;with chair alarm set;with family/visitor present Nurse Communication: Mobility status PT Visit Diagnosis: Other abnormalities of gait and mobility (R26.89);Muscle weakness (generalized) (M62.81)     Time: 1657-9038 PT Time Calculation (min) (ACUTE ONLY): 16 min  Charges:  $Gait Training: 8-22 mins                     Stacie Glaze, PT DPT Acute Rehabilitation Services Pager [phone]  Office [phone]   [place] 06/20/2022, 3:35 PM

## 2022-06-20 NOTE — Progress Notes (Signed)
Subjective: Patient reports that he is feeling much better today. No headaches. NAE ON  Objective: Vital signs in last 24 hours: Temp:  [97.4 F (36.3 C)-99.1 F (37.3 C)] 98.4 F (36.9 C) (10/05 0810) Pulse Rate:  [68-93] 77 (10/05 0810) Resp:  [16-20] 18 (10/05 0810) BP: (112-144)/(52-84) 119/60 (10/05 0810) SpO2:  [94 %-98 %] 94 % (10/05 0810)  Intake/Output from previous day: 10/04 0701 - 10/05 0700 In: 2399.9 [P.O.:580; I.V.:1419.9; IV Piggyback:400] Out: 400 [Urine:400] Intake/Output this shift: Total I/O In: -  Out: 500 [Urine:500]  Physical Exam: Patient is awake, A/O X 3, disoriented to situation. He is conversant, and in good spirits. Eyes open spontaneously. They are in NAD and VSS. Doing well. Speech is fluent and appropriate. Mild dysarthria persists, although significantly improved. MAEW with good strength. RUE and RLE 5/5, LUE and LLE 4/5. Sensation to light touch is intact. PERLA, EOMI. CNs grossly intact except for 7th CN, mild right facial asymmetry most prominent along the right nasolabial fold.     Lab Results: Recent Labs    06/19/22 0548 06/20/22 0806  WBC 8.9 7.2  HGB 12.4* 11.9*  HCT 34.9* 34.4*  PLT 189 179   BMET Recent Labs    06/19/22 0548 06/20/22 0806  NA 141 140  K 3.0* 3.4*  CL 107 109  CO2 24 24  GLUCOSE 85 84  BUN 19 16  CREATININE 1.27* 1.13  CALCIUM 8.0* 7.9*    Studies/Results: MR BRAIN WO CONTRAST  Result Date: 06/19/2022 CLINICAL DATA:  Slurred speech, right facial droop, fall. Subdural hematoma. EXAM: MRI HEAD WITHOUT CONTRAST TECHNIQUE: Multiplanar, multiecho pulse sequences of the brain and surrounding structures were obtained without intravenous contrast. COMPARISON:  Same-day noncontrast head CT FINDINGS: Brain: Again seen is an acute right cerebral convexity subdural hematoma measuring up to 9 mm in thickness over the frontal lobe with additional blood products layering along the falx. There is a smaller subdural  collection over the left cerebral convexity measuring up to 3 mm in thickness without definite acute blood products on the same-day CT. The right-sided collection results in mild mass effect with mild sulcal effacement and 3 mm leftward midline shift, unchanged. There is probable small volume subarachnoid hemorrhage overlying the left parietal lobe. There is no evidence of acute territorial infarct. Background parenchymal volume is normal for age. The ventricles are normal in size. Gray-white differentiation is preserved. Parenchymal signal is normal for age with no significant burden of underlying white matter disease. There is no solid mass lesion. Vascular: Normal flow voids. Skull and upper cervical spine: Normal marrow signal. Sinuses/Orbits: The paranasal sinuses are clear. The globes and orbits are unremarkable. Other: None. IMPRESSION: 1. Acute subdural blood over the right cerebral convexity and layering along the falx measuring up to 9 mm in thickness with mild mass effect and 3 mm leftward midline shift, unchanged. 2. Smaller left cerebral convexity subdural collection measuring up to 3 mm without definite acute blood products on the same-day CT. 3. Probable small volume subarachnoid hemorrhage over the left parietal lobe. 4. No evidence of acute infarct. Electronically Signed   By: Valetta Mole M.D.   On: 06/19/2022 08:58   CT HEAD WO CONTRAST (5MM)  Result Date: 06/19/2022 CLINICAL DATA:  Subdural hematoma follow-up EXAM: CT HEAD WITHOUT CONTRAST TECHNIQUE: Contiguous axial images were obtained from the base of the skull through the vertex without intravenous contrast. RADIATION DOSE REDUCTION: This exam was performed according to the departmental dose-optimization program which includes  automated exposure control, adjustment of the mA and/or kV according to patient size and/or use of iterative reconstruction technique. COMPARISON:  None Available. FINDINGS: Brain: Unchanged right convexity subdural  hematoma, 10 mm thick. Component along the falx cerebri is unchanged. No new site of hemorrhage. Mild mass effect is unchanged. Vascular: Atherosclerotic calcification of the internal carotid arteries at the skull base. No abnormal hyperdensity of the major intracranial arteries or dural venous sinuses. Skull: The visualized skull base, calvarium and extracranial soft tissues are normal. Sinuses/Orbits: No fluid levels or advanced mucosal thickening of the visualized paranasal sinuses. No mastoid or middle ear effusion. The orbits are normal. IMPRESSION: 1. Unchanged right convexity subdural hematoma, 10 mm thick, with mild mass effect. 2. No new site of hemorrhage. Electronically Signed   By: Ulyses Jarred M.D.   On: 06/19/2022 00:38   CT Cervical Spine Wo Contrast  Result Date: 06/18/2022 CLINICAL DATA:  Fall with trauma to the head and neck. EXAM: CT CERVICAL SPINE WITHOUT CONTRAST TECHNIQUE: Multidetector CT imaging of the cervical spine was performed without intravenous contrast. Multiplanar CT image reconstructions were also generated. RADIATION DOSE REDUCTION: This exam was performed according to the departmental dose-optimization program which includes automated exposure control, adjustment of the mA and/or kV according to patient size and/or use of iterative reconstruction technique. COMPARISON:  None Available. FINDINGS: Alignment: No malalignment. Skull base and vertebrae: No regional fracture. Chronic anterior osteophytosis from C2-C7. Soft tissues and spinal canal: No traumatic soft tissue finding. Disc levels: No bony encroachment upon the canal or foramina. No facet arthropathy. Upper chest: Not included. Other: None IMPRESSION: No acute or traumatic finding. Chronic anterior osteophytosis from C2-C7. Wide patency of the canal and foramina. Electronically Signed   By: Nelson Chimes M.D.   On: 06/18/2022 18:23   DG Knee Complete 4 Views Left  Result Date: 06/18/2022 CLINICAL DATA:  Fall, knee  pain EXAM: LEFT KNEE - COMPLETE 4+ VIEW COMPARISON:  Report from left knee radiographs dated 07/12/2021 FINDINGS: Mild to moderate articular space narrowing in the medial compartment with mild marginal spurring. There is also a marginal spurring in the patellofemoral joint. Mild prepatellar subcutaneous edema. Moderate left knee joint effusion in the suprapatellar bursa. No fracture is identified. IMPRESSION: 1. Moderate left knee joint effusion in the suprapatellar bursa. 2. Mild-to-moderate osteoarthritis. 3. Mild prepatellar subcutaneous edema. Electronically Signed   By: Van Clines M.D.   On: 06/18/2022 18:22   CT Head Wo Contrast  Result Date: 06/18/2022 CLINICAL DATA:  [place].  Head trauma. EXAM: CT HEAD WITHOUT CONTRAST TECHNIQUE: Contiguous axial images were obtained from the base of the skull through the vertex without intravenous contrast. RADIATION DOSE REDUCTION: This exam was performed according to the departmental dose-optimization program which includes automated exposure control, adjustment of the mA and/or kV according to patient size and/or use of iterative reconstruction technique. COMPARISON:  None Available. FINDINGS: Brain: Acute subdural hematoma on the right. Largest component is along the lateral convexity measuring up to 11 mm in thickness. Smaller component along the falx measuring 3 mm in thickness. No intraparenchymal or subarachnoid bleeding. Mild mass effect with right-to-left shift of 3 mm. No evidence of pre-existing brain infarction. No hydrocephalus. There appears to be a small chronic subdural in the anterior cranial fossa on the left measuring 4 mm in thickness, much more localized. Vascular: There is atherosclerotic calcification of the major vessels at the base of the brain. Skull: No skull fracture. Sinuses/Orbits: Clear/normal Other: None IMPRESSION: Acute subdural hematoma  on the right. Largest component along the lateral convexity measuring up to 11 mm in  thickness. Smaller component along the falx measuring 3 mm in thickness. Mild mass effect with right-to-left shift of 3 mm. Small chronic subdural hematoma in the anterior cranial fossa on the left measuring only 4 mm. Critical Value/emergent results were called by telephone at the time of interpretation on 06/18/2022 at 6:19 pm to provider Garnette Gunner , who verbally acknowledged these results. Electronically Signed   By: Nelson Chimes M.D.   On: 06/18/2022 18:21   DG Knee Complete 4 Views Right  Result Date: 06/18/2022 CLINICAL DATA:  Fall, knee pain EXAM: RIGHT KNEE - COMPLETE 4+ VIEW COMPARISON:  07/12/2021 report and images from 09/19/2012 FINDINGS: Mild medial compartmental articular space narrowing. Prepatellar soft tissue swelling likely from infiltrative edema, a component of prepatellar bursitis is not excluded. Indistinctness of the spurring and calcifications at the quadriceps attachment site, questionable expansion of the distal quadriceps which could be from tendinopathy. Partial fragmentation of the quadriceps spurring is age indeterminate. No significant additional bony findings. IMPRESSION: 1. Prepatellar soft tissue swelling likely from infiltrative edema, a component of prepatellar bursitis is not excluded. 2. Partial fragmentation of the quadriceps enthesophyte at the attachment site, with questionable expansion of the distal quadriceps. This could be from tendinopathy. Partial fragmentation of the quadriceps spurring is age indeterminate. Electronically Signed   By: Van Clines M.D.   On: 06/18/2022 18:20   DG Elbow Complete Left  Result Date: 06/18/2022 CLINICAL DATA:  Fall, elbow pain EXAM: LEFT ELBOW - COMPLETE 3+ VIEW COMPARISON:  None Available. FINDINGS: No fracture or joint effusion identified. Spurring at the triceps attachment to the olecranon. Potentially some mild subcutaneous edema posterior to the distal humerus. IMPRESSION: 1. No fracture or joint effusion  identified. 2. Potential mild subcutaneous edema posterior to the distal humerus. Electronically Signed   By: Van Clines M.D.   On: 06/18/2022 18:17   DG Shoulder Left  Result Date: 06/18/2022 CLINICAL DATA:  Status post fall. EXAM: LEFT SHOULDER - 2+ VIEW COMPARISON:  None Available. FINDINGS: There is no evidence of fracture or dislocation. Moderate severity degenerative changes are seen involving the left acromioclavicular joint and left glenohumeral articulation. Soft tissues are unremarkable. IMPRESSION: Moderate severity degenerative changes without evidence of acute osseous abnormality. Electronically Signed   By: Virgina Norfolk M.D.   On: 06/18/2022 18:15    Assessment/Plan: 88 y.o. male who was brought to the ED after being found down following a fall. CT imaging was reviewed and revealed an acute SDH along the right lateral convexity measuring up to 1.1 cm with approximately 3 mm of right-to-left MLS. There is also a small chronic SDH in the anterior cranial fossa on the left measuring 4 mm. He was transferred to [facility] for continued evaluation and close neurological monitoring. Repeat CT head revealed stable appearance of the right convexity SDH without any areas of new hemorrhage. No acute neurosurgical intervention is indicated. Patient to follow up as an outpatient with repeat CT head in 2 weeks for monitoring of chronic SDH. Keppra 500 mg BID X 7 days.      LOS: 2 days     Marvis Moeller, DNP, AGNP-C Neurosurgery Nurse Practitioner  [facility] [place] [address], [address], [place], [place] [address] P: [phone]    F: [phone]  06/20/2022 10:24 AM

## 2022-06-20 NOTE — Progress Notes (Signed)
PROGRESS NOTE    Grant Martinez  XHB:[id number] DOB: 1934/01/12 DOA: 06/18/2022 PCP: Curlene Labrum, MD   Brief Narrative:  Grant Martinez is a 88 y.o. male with medical history significant for hypertension, gout, prostate cancer.  Patient was brought to the ED after he was found on the floor by his niece this afternoon. CT head-shows acute subdural hematoma on the right, largest complaint along the lateral convexity measuring up to 11 mm in thickness.  Smaller component measuring 3 mm. Mild mass effect.  Also small chronic subdural hematoma.  Hospitalist called for admission, neurosurgery called for consult.  Assessment & Plan:   Principal Problem:   Subdural hematoma (HCC) Active Problems:   Fall at home, initial encounter   Rhabdomyolysis   Right knee pain   Prostate cancer ([place])   Hypokalemia   Elevated troponin   HTN (hypertension)   Subdural hematoma ([place]), stable - Status post presumed mechanical fall.   - CT findings of- Acute subdural hematoma on the right. Largest component along the lateral convexity measuring up to 11 mm in thickness. Smaller component along the falx measuring 3 mm in thickness. Mild mass effect with right-to-left shift of 3 mm. -Neurosurgery following, appreciate insight and recommendations -repeat CT head stable, no acute neurosurgical intervention indicated.  Continue Keppra twice daily as suggested.   Fall at home, initial encounter Sustaining subdural hematoma and laceration to left forehead.  Likely mechanical fall from chronic left knee pain and swelling.  Family also reports slurred speech and right facial droop.  Need to rule out stroke also.  X-rays of left elbow, left and right knee and left shoulder negative for fractures, cervical spine. -MRI brain without contrast - Forehead Laceration sutured in ED by EDP   Rhabdomyolysis, improving AKI presumed, unclear baseline Profound dehydration in the setting of prolonged downtime CK 7958 at  intake, downtrending appropriately but not yet back to baseline, weakness fatigue and muscle pain improving -Advanced p.o. intake, follow repeat CK level, if more appropriate will discharge in the next 24 to 48 hours pending symptoms. -Creatinine downtrending appropriately, likely confirming AKI   Right knee pain Left knee pain Left elbow pain - Acute on chronic secondary to recent fall given above  -Improving, range of motion intact without impingement, crepitus, or other pain   HTN (hypertension) -Hold, blood pressure currently well controlled   Elevated troponin -Likely supply/demand mismatch in setting of rhabdo as above, without chest pain or other symptomatology, no longer following troponin, EKG without overt ST changes   Hypokalemia Ongoing, likely complicated by HCTZ and dehydration   Prostate cancer [facility]) Follows with urologist Dr. Chrystine Oiler in [place].  On leuprolide injections every 3 months.  DVT prophylaxis: Holding given above Code Status: Full Family Communication: At bedside  Status is: Inpatient  Dispo: The patient is from: Home               Anticipated d/c is to: Home              Anticipated d/c date is: 24 to 48 hours              Patient currently not medically stable for discharge  Consultants:  Neurosurgery  Procedures:  None  Antimicrobials:  None  Subjective: No acute issues or events overnight, left elbow bilateral knee pain improving, worse with ambulation but generally improving from prior.  Otherwise denies nausea vomiting diarrhea constipation headache fevers chills chest pain  Objective: Vitals:   06/19/22 1930  06/20/22 0000 06/20/22 0400 06/20/22 0810  BP: 128/63 129/64 (!) 117/52 119/60  Pulse: 74 87 87 77  Resp: '20 16 20 18  '$ Temp: (!) 97.4 F (36.3 C) 98.5 F (36.9 C) 98.2 F (36.8 C) 98.4 F (36.9 C)  TempSrc: Axillary Oral Axillary Oral  SpO2: 98% 98% 95% 94%  Weight:      Height:        Intake/Output Summary (Last  24 hours) at 06/20/2022 0816 Last data filed at 06/20/2022 0000 Gross per 24 hour  Intake 1908.19 ml  Output 400 ml  Net 1508.19 ml    Filed Weights   06/18/22 1658 06/19/22 0107  Weight: 86.2 kg 89.1 kg    Examination:  General:  Pleasantly resting in bed, No acute distress. HEENT:  Normocephalic atraumatic.  Sclerae nonicteric, noninjected.  Extraocular movements intact bilaterally. Neck:  Without mass or deformity.  Trachea is midline. Lungs:  Clear to auscultate bilaterally without rhonchi, wheeze, or rales. Heart:  Regular rate and rhythm.  Without murmurs, rubs, or gallops. Abdomen:  Soft, nontender, nondistended.  Without guarding or rebound. Extremities: Without cyanosis, clubbing, edema, or obvious deformity. Vascular:  Dorsalis pedis and posterior tibial pulses palpable bilaterally. Skin: Traumatic ecchymoses and skin tear over left anterior forehead left elbow and bilateral knees  Data Reviewed: I have personally reviewed following labs and imaging studies  CBC: Recent Labs  Lab 06/18/22 1740 06/19/22 0548  WBC 13.5* 8.9  NEUTROABS 10.9*  --   HGB 15.9 12.4*  HCT 46.2 34.9*  MCV 90.1 90.2  PLT 265 749    Basic Metabolic Panel: Recent Labs  Lab 06/18/22 1740 06/19/22 0548  NA 145 141  K 3.3* 3.0*  CL 105 107  CO2 26 24  GLUCOSE 121* 85  BUN 22 19  CREATININE 1.53* 1.27*  CALCIUM 9.3 8.0*    GFR: Estimated Creatinine Clearance: 46.1 mL/min (A) (by C-G formula based on SCr of 1.27 mg/dL (H)). Liver Function Tests: Recent Labs  Lab 06/18/22 1740  AST 192*  ALT 72*  ALKPHOS 62  BILITOT 2.4*  PROT 7.1  ALBUMIN 4.2    No results for input(s): "LIPASE", "AMYLASE" in the last 168 hours. No results for input(s): "AMMONIA" in the last 168 hours. Coagulation Profile: Recent Labs  Lab 06/18/22 1904  INR 1.2    Cardiac Enzymes: Recent Labs  Lab 06/18/22 1740 06/19/22 0548  CKTOTAL 7,958* 3,640*    BNP (last 3 results) No results for  input(s): "PROBNP" in the last 8760 hours. HbA1C: No results for input(s): "HGBA1C" in the last 72 hours. CBG: No results for input(s): "GLUCAP" in the last 168 hours. Lipid Profile: No results for input(s): "CHOL", "HDL", "LDLCALC", "TRIG", "CHOLHDL", "LDLDIRECT" in the last 72 hours. Thyroid Function Tests: No results for input(s): "TSH", "T4TOTAL", "FREET4", "T3FREE", "THYROIDAB" in the last 72 hours. Anemia Panel: No results for input(s): "VITAMINB12", "FOLATE", "FERRITIN", "TIBC", "IRON", "RETICCTPCT" in the last 72 hours. Sepsis Labs: No results for input(s): "PROCALCITON", "LATICACIDVEN" in the last 168 hours.  Recent Results (from the past 240 hour(s))  MRSA Next Gen by PCR, Nasal     Status: None   Collection Time: 06/19/22  1:41 AM   Specimen: Nasal Mucosa; Nasal Swab  Result Value Ref Range Status   MRSA by PCR Next Gen NOT DETECTED NOT DETECTED Final    Comment: (NOTE) The GeneXpert MRSA Assay (FDA approved for NASAL specimens only), is one component of a comprehensive MRSA colonization surveillance program. It is  not intended to diagnose MRSA infection nor to guide or monitor treatment for MRSA infections. Test performance is not FDA approved in patients less than 87 years old. Performed at [facility], [place] [address] [place], [place] [address]          Radiology Studies: MR BRAIN WO CONTRAST  Result Date: 06/19/2022 CLINICAL DATA:  Slurred speech, right facial droop, fall. Subdural hematoma. EXAM: MRI HEAD WITHOUT CONTRAST TECHNIQUE: Multiplanar, multiecho pulse sequences of the brain and surrounding structures were obtained without intravenous contrast. COMPARISON:  Same-day noncontrast head CT FINDINGS: Brain: Again seen is an acute right cerebral convexity subdural hematoma measuring up to 9 mm in thickness over the frontal lobe with additional blood products layering along the falx. There is a smaller subdural collection over the left cerebral convexity  measuring up to 3 mm in thickness without definite acute blood products on the same-day CT. The right-sided collection results in mild mass effect with mild sulcal effacement and 3 mm leftward midline shift, unchanged. There is probable small volume subarachnoid hemorrhage overlying the left parietal lobe. There is no evidence of acute territorial infarct. Background parenchymal volume is normal for age. The ventricles are normal in size. Gray-white differentiation is preserved. Parenchymal signal is normal for age with no significant burden of underlying white matter disease. There is no solid mass lesion. Vascular: Normal flow voids. Skull and upper cervical spine: Normal marrow signal. Sinuses/Orbits: The paranasal sinuses are clear. The globes and orbits are unremarkable. Other: None. IMPRESSION: 1. Acute subdural blood over the right cerebral convexity and layering along the falx measuring up to 9 mm in thickness with mild mass effect and 3 mm leftward midline shift, unchanged. 2. Smaller left cerebral convexity subdural collection measuring up to 3 mm without definite acute blood products on the same-day CT. 3. Probable small volume subarachnoid hemorrhage over the left parietal lobe. 4. No evidence of acute infarct. Electronically Signed   By: Valetta Mole M.D.   On: 06/19/2022 08:58   CT HEAD WO CONTRAST (5MM)  Result Date: 06/19/2022 CLINICAL DATA:  Subdural hematoma follow-up EXAM: CT HEAD WITHOUT CONTRAST TECHNIQUE: Contiguous axial images were obtained from the base of the skull through the vertex without intravenous contrast. RADIATION DOSE REDUCTION: This exam was performed according to the departmental dose-optimization program which includes automated exposure control, adjustment of the mA and/or kV according to patient size and/or use of iterative reconstruction technique. COMPARISON:  None Available. FINDINGS: Brain: Unchanged right convexity subdural hematoma, 10 mm thick. Component along the  falx cerebri is unchanged. No new site of hemorrhage. Mild mass effect is unchanged. Vascular: Atherosclerotic calcification of the internal carotid arteries at the skull base. No abnormal hyperdensity of the major intracranial arteries or dural venous sinuses. Skull: The visualized skull base, calvarium and extracranial soft tissues are normal. Sinuses/Orbits: No fluid levels or advanced mucosal thickening of the visualized paranasal sinuses. No mastoid or middle ear effusion. The orbits are normal. IMPRESSION: 1. Unchanged right convexity subdural hematoma, 10 mm thick, with mild mass effect. 2. No new site of hemorrhage. Electronically Signed   By: Ulyses Jarred M.D.   On: 06/19/2022 00:38   CT Cervical Spine Wo Contrast  Result Date: 06/18/2022 CLINICAL DATA:  Fall with trauma to the head and neck. EXAM: CT CERVICAL SPINE WITHOUT CONTRAST TECHNIQUE: Multidetector CT imaging of the cervical spine was performed without intravenous contrast. Multiplanar CT image reconstructions were also generated. RADIATION DOSE REDUCTION: This exam was performed according  to the departmental dose-optimization program which includes automated exposure control, adjustment of the mA and/or kV according to patient size and/or use of iterative reconstruction technique. COMPARISON:  None Available. FINDINGS: Alignment: No malalignment. Skull base and vertebrae: No regional fracture. Chronic anterior osteophytosis from C2-C7. Soft tissues and spinal canal: No traumatic soft tissue finding. Disc levels: No bony encroachment upon the canal or foramina. No facet arthropathy. Upper chest: Not included. Other: None IMPRESSION: No acute or traumatic finding. Chronic anterior osteophytosis from C2-C7. Wide patency of the canal and foramina. Electronically Signed   By: Nelson Chimes M.D.   On: 06/18/2022 18:23   DG Knee Complete 4 Views Left  Result Date: 06/18/2022 CLINICAL DATA:  Fall, knee pain EXAM: LEFT KNEE - COMPLETE 4+ VIEW  COMPARISON:  Report from left knee radiographs dated 07/12/2021 FINDINGS: Mild to moderate articular space narrowing in the medial compartment with mild marginal spurring. There is also a marginal spurring in the patellofemoral joint. Mild prepatellar subcutaneous edema. Moderate left knee joint effusion in the suprapatellar bursa. No fracture is identified. IMPRESSION: 1. Moderate left knee joint effusion in the suprapatellar bursa. 2. Mild-to-moderate osteoarthritis. 3. Mild prepatellar subcutaneous edema. Electronically Signed   By: Van Clines M.D.   On: 06/18/2022 18:22   CT Head Wo Contrast  Result Date: 06/18/2022 CLINICAL DATA:  [place].  Head trauma. EXAM: CT HEAD WITHOUT CONTRAST TECHNIQUE: Contiguous axial images were obtained from the base of the skull through the vertex without intravenous contrast. RADIATION DOSE REDUCTION: This exam was performed according to the departmental dose-optimization program which includes automated exposure control, adjustment of the mA and/or kV according to patient size and/or use of iterative reconstruction technique. COMPARISON:  None Available. FINDINGS: Brain: Acute subdural hematoma on the right. Largest component is along the lateral convexity measuring up to 11 mm in thickness. Smaller component along the falx measuring 3 mm in thickness. No intraparenchymal or subarachnoid bleeding. Mild mass effect with right-to-left shift of 3 mm. No evidence of pre-existing brain infarction. No hydrocephalus. There appears to be a small chronic subdural in the anterior cranial fossa on the left measuring 4 mm in thickness, much more localized. Vascular: There is atherosclerotic calcification of the major vessels at the base of the brain. Skull: No skull fracture. Sinuses/Orbits: Clear/normal Other: None IMPRESSION: Acute subdural hematoma on the right. Largest component along the lateral convexity measuring up to 11 mm in thickness. Smaller component along the falx  measuring 3 mm in thickness. Mild mass effect with right-to-left shift of 3 mm. Small chronic subdural hematoma in the anterior cranial fossa on the left measuring only 4 mm. Critical Value/emergent results were called by telephone at the time of interpretation on 06/18/2022 at 6:19 pm to provider Garnette Gunner , who verbally acknowledged these results. Electronically Signed   By: Nelson Chimes M.D.   On: 06/18/2022 18:21   DG Knee Complete 4 Views Right  Result Date: 06/18/2022 CLINICAL DATA:  Fall, knee pain EXAM: RIGHT KNEE - COMPLETE 4+ VIEW COMPARISON:  07/12/2021 report and images from 09/19/2012 FINDINGS: Mild medial compartmental articular space narrowing. Prepatellar soft tissue swelling likely from infiltrative edema, a component of prepatellar bursitis is not excluded. Indistinctness of the spurring and calcifications at the quadriceps attachment site, questionable expansion of the distal quadriceps which could be from tendinopathy. Partial fragmentation of the quadriceps spurring is age indeterminate. No significant additional bony findings. IMPRESSION: 1. Prepatellar soft tissue swelling likely from infiltrative edema, a component of prepatellar bursitis  is not excluded. 2. Partial fragmentation of the quadriceps enthesophyte at the attachment site, with questionable expansion of the distal quadriceps. This could be from tendinopathy. Partial fragmentation of the quadriceps spurring is age indeterminate. Electronically Signed   By: Van Clines M.D.   On: 06/18/2022 18:20   DG Elbow Complete Left  Result Date: 06/18/2022 CLINICAL DATA:  Fall, elbow pain EXAM: LEFT ELBOW - COMPLETE 3+ VIEW COMPARISON:  None Available. FINDINGS: No fracture or joint effusion identified. Spurring at the triceps attachment to the olecranon. Potentially some mild subcutaneous edema posterior to the distal humerus. IMPRESSION: 1. No fracture or joint effusion identified. 2. Potential mild subcutaneous edema  posterior to the distal humerus. Electronically Signed   By: Van Clines M.D.   On: 06/18/2022 18:17   DG Shoulder Left  Result Date: 06/18/2022 CLINICAL DATA:  Status post fall. EXAM: LEFT SHOULDER - 2+ VIEW COMPARISON:  None Available. FINDINGS: There is no evidence of fracture or dislocation. Moderate severity degenerative changes are seen involving the left acromioclavicular joint and left glenohumeral articulation. Soft tissues are unremarkable. IMPRESSION: Moderate severity degenerative changes without evidence of acute osseous abnormality. Electronically Signed   By: Virgina Norfolk M.D.   On: 06/18/2022 18:15    Scheduled Meds:  influenza vaccine adjuvanted  0.5 mL Intramuscular Tomorrow-1000   levETIRAcetam  500 mg Oral BID   LORazepam  0.5 mg Intravenous Once   Tdap  0.5 mL Intramuscular Once   Continuous Infusions:     LOS: 2 days   Time spent: 30mn  Manessa Buley C Dayami Taitt, DO [facility]  If 7PM-7AM, please contact night-coverage [email or web address]  06/20/2022, 8:16 AM

## 2022-06-20 NOTE — Progress Notes (Signed)
Pt requires a bedside commode as he has difficulty getting to the regular bathroom in a timely manner.

## 2022-06-21 DIAGNOSIS — S065XAA Traumatic subdural hemorrhage with loss of consciousness status unknown, initial encounter: Secondary | ICD-10-CM | POA: Diagnosis not present

## 2022-06-21 LAB — CK: Total CK: 1023 U/L — ABNORMAL HIGH (ref 49–397)

## 2022-06-21 LAB — BASIC METABOLIC PANEL
Anion gap: 8 (ref 5–15)
BUN: 16 mg/dL (ref 8–23)
CO2: 27 mmol/L (ref 22–32)
Calcium: 8.4 mg/dL — ABNORMAL LOW (ref 8.9–10.3)
Chloride: 107 mmol/L (ref 98–111)
Creatinine, Ser: 1.12 mg/dL (ref 0.61–1.24)
GFR, Estimated: >60 mL/min (ref >60)
Glucose, Bld: 104 mg/dL — ABNORMAL HIGH (ref 70–99)
Potassium: 3.3 mmol/L — ABNORMAL LOW (ref 3.5–5.1)
Sodium: 142 mmol/L (ref 135–145)

## 2022-06-21 MED ORDER — LEVETIRACETAM 500 MG PO TABS: 500.0000 mg | ORAL_TABLET | Freq: Two times a day (BID) | ORAL | 0 refills | Status: DC

## 2022-06-21 NOTE — TOC Transition Note (Signed)
Transition of Care [facility]) - CM/SW Discharge Note   Patient Details  Name: Grant Martinez MRN: [id number] Date of Birth: 02/07/1934  [facility] CM/SW Contact:  Pollie Friar, RN Phone Number: 06/21/2022, 10:08 AM   Clinical Narrative:    Pt is discharging home with home health services through [facility]. Information on the AVS.  Niece to provide needed supervision and assistance at home.  Pt has transportation home.    Final next level of care: Home w Home Health Services Barriers to Discharge: No Barriers Identified   Patient Goals and CMS Choice   CMS Medicare.gov Compare Post Acute Care list provided to:: Patient Choice offered to / list presented to : Patient (niece)  Discharge Placement                       Discharge Plan and Services   Discharge Planning Services: CM Consult Post Acute Care Choice: Home Health, Durable Medical Equipment          DME Arranged: Bedside commode, Walker rolling DME Agency: [employer] Date DME Agency Contacted: 06/20/22   Representative spoke with at DME Agency: [place]: PT, OT [place] Agency: [place] Date [place]: 06/20/22   Representative spoke with at [place]: [place] ([place]) Interventions     Readmission Risk Interventions     No data to display

## 2022-06-21 NOTE — Discharge Summary (Signed)
Physician Discharge Summary  Grant Martinez IRS:[id number] DOB: 11-02-1933 DOA: 06/18/2022  PCP: Curlene Labrum, MD  Admit date: 06/18/2022 Discharge date: 06/21/2022  Admitted From: Home Disposition:  Home  Recommendations for Outpatient Follow-up:  Follow up with PCP in 1-2 weeks Repeat imaging per neurosurgery  Home Health: Home health Equipment/Devices:None  Discharge Condition:Stable  CODE STATUS:Full  Diet recommendation:  Low salt low fat diet  Brief/Interim Summary: Grant Martinez is a 88 y.o. male with medical history significant for hypertension, gout, prostate cancer.  Patient was brought to the ED after he was found on the floor by his niece this afternoon. CT head-shows acute subdural hematoma on the right, largest complaint along the lateral convexity measuring up to 11 mm in thickness.  Smaller component measuring 3 mm. Mild mass effect.  Also small chronic subdural hematoma.  Hospitalist called for admission, neurosurgery called for consult.     Subdural hematoma (HCC), stable - Status post presumed mechanical fall.   - CT findings of- Acute subdural hematoma on the right. Largest component along the lateral convexity measuring up to 11 mm in thickness. Smaller component along the falx measuring 3 mm in thickness. Mild mass effect with right-to-left shift of 3 mm. -Neurosurgery following, appreciate insight and recommendations -repeat CT head stable, no acute neurosurgical intervention indicated.  Continue Keppra twice daily as suggested x14 days.   Fall at home, initial encounter Sustaining subdural hematoma and laceration to left forehead.  Likely mechanical fall from chronic left knee pain and swelling.  Family also reports slurred speech and right facial droop.  Need to rule out stroke also.  X-rays of left elbow, left and right knee and left shoulder negative for fractures, cervical spine. -MRI brain without contrast as above -Forehead Laceration sutured in ED by  EDP - suture removal in 1 week - follow PCP or return to ED for return   Rhabdomyolysis, improving AKI presumed, unclear baseline Profound dehydration in the setting of prolonged downtime - CK 7958 at intake, downtrending appropriately but not yet back to baseline, weakness fatigue and muscle pain improving - Advanced p.o. intake, follow repeat CK level, if more appropriate will discharge in the next 24 to 48 hours pending symptoms. - Creatinine downtrending appropriately, likely confirming AKI   Right knee pain Left knee pain Left elbow pain - Acute on chronic secondary to recent fall given above  - Improving, range of motion intact without impingement, crepitus, or other pain   HTN (hypertension) - Hold, blood pressure currently well controlled   Elevated troponin - Likely supply/demand mismatch in setting of rhabdo as above, without chest pain or other symptomatology, no longer following troponin, EKG without overt ST changes   Hypokalemia - Ongoing, likely complicated by HCTZ and dehydration   Prostate cancer ([place]) - Follows with urologist Dr. Chrystine Oiler in [place].  On leuprolide injections every 3 months.  Discharge Diagnoses:  Principal Problem:   Subdural hematoma (HCC) Active Problems:   Fall at home, initial encounter   Rhabdomyolysis   Right knee pain   Prostate cancer ([place])   Hypokalemia   Elevated troponin   HTN (hypertension)    Discharge Instructions   Allergies as of 06/21/2022   No Known Allergies      Medication List     STOP taking these medications    hydrochlorothiazide 25 MG tablet Commonly known as: HYDRODIURIL   Myrbetriq 50 MG Tb24 tablet Generic drug: mirabegron ER   pravastatin 20 MG tablet Commonly known as:  PRAVACHOL   predniSONE 20 MG tablet Commonly known as: DELTASONE       TAKE these medications    atorvastatin 10 MG tablet Commonly known as: LIPITOR Take 1 tablet by mouth daily.   colchicine 0.6 MG  tablet Take 0.6 mg by mouth daily as needed (gout flare-ups).   diclofenac Sodium 1 % Gel Commonly known as: VOLTAREN Apply 2 g topically 4 (four) times daily.   Leuprolide Acetate (3 Month) 22.5 MG injection Commonly known as: ELIGARD Inject 22.5 mg into the skin every 3 (three) months.   levETIRAcetam 500 MG tablet Commonly known as: KEPPRA Take 1 tablet (500 mg total) by mouth 2 (two) times daily.   levothyroxine 50 MCG tablet Commonly known as: SYNTHROID Take 50 mcg by mouth daily.   multivitamin tablet Take 1 tablet by mouth daily.   OVER THE COUNTER MEDICATION Blue emu   Vitamin D3 125 MCG (5000 UT) Caps Take 1 capsule by mouth daily.        Follow-up Information     Vallarie Mare, MD Follow up in 2 week(s).   Specialty: Neurosurgery Contact information: [place] [address] [phone]         Care, [facility] Follow up.   Specialty: Home Health Services Why: The home health agency will contact you for the first home visit. Contact information: [place] [address] [phone]                No Known Allergies  Consultations: NeuroSx  Procedures/Studies: MR BRAIN WO CONTRAST  Result Date: 06/19/2022 CLINICAL DATA:  Slurred speech, right facial droop, fall. Subdural hematoma. EXAM: MRI HEAD WITHOUT CONTRAST TECHNIQUE: Multiplanar, multiecho pulse sequences of the brain and surrounding structures were obtained without intravenous contrast. COMPARISON:  Same-day noncontrast head CT FINDINGS: Brain: Again seen is an acute right cerebral convexity subdural hematoma measuring up to 9 mm in thickness over the frontal lobe with additional blood products layering along the falx. There is a smaller subdural collection over the left cerebral convexity measuring up to 3 mm in thickness without definite acute blood products on the same-day CT. The right-sided collection results in mild  mass effect with mild sulcal effacement and 3 mm leftward midline shift, unchanged. There is probable small volume subarachnoid hemorrhage overlying the left parietal lobe. There is no evidence of acute territorial infarct. Background parenchymal volume is normal for age. The ventricles are normal in size. Gray-white differentiation is preserved. Parenchymal signal is normal for age with no significant burden of underlying white matter disease. There is no solid mass lesion. Vascular: Normal flow voids. Skull and upper cervical spine: Normal marrow signal. Sinuses/Orbits: The paranasal sinuses are clear. The globes and orbits are unremarkable. Other: None. IMPRESSION: 1. Acute subdural blood over the right cerebral convexity and layering along the falx measuring up to 9 mm in thickness with mild mass effect and 3 mm leftward midline shift, unchanged. 2. Smaller left cerebral convexity subdural collection measuring up to 3 mm without definite acute blood products on the same-day CT. 3. Probable small volume subarachnoid hemorrhage over the left parietal lobe. 4. No evidence of acute infarct. Electronically Signed   By: Valetta Mole M.D.   On: 06/19/2022 08:58   CT HEAD WO CONTRAST (5MM)  Result Date: 06/19/2022 CLINICAL DATA:  Subdural hematoma follow-up EXAM: CT HEAD WITHOUT CONTRAST TECHNIQUE: Contiguous axial images were obtained from the base of the skull through  the vertex without intravenous contrast. RADIATION DOSE REDUCTION: This exam was performed according to the departmental dose-optimization program which includes automated exposure control, adjustment of the mA and/or kV according to patient size and/or use of iterative reconstruction technique. COMPARISON:  None Available. FINDINGS: Brain: Unchanged right convexity subdural hematoma, 10 mm thick. Component along the falx cerebri is unchanged. No new site of hemorrhage. Mild mass effect is unchanged. Vascular: Atherosclerotic calcification of the  internal carotid arteries at the skull base. No abnormal hyperdensity of the major intracranial arteries or dural venous sinuses. Skull: The visualized skull base, calvarium and extracranial soft tissues are normal. Sinuses/Orbits: No fluid levels or advanced mucosal thickening of the visualized paranasal sinuses. No mastoid or middle ear effusion. The orbits are normal. IMPRESSION: 1. Unchanged right convexity subdural hematoma, 10 mm thick, with mild mass effect. 2. No new site of hemorrhage. Electronically Signed   By: Ulyses Jarred M.D.   On: 06/19/2022 00:38   CT Cervical Spine Wo Contrast  Result Date: 06/18/2022 CLINICAL DATA:  Fall with trauma to the head and neck. EXAM: CT CERVICAL SPINE WITHOUT CONTRAST TECHNIQUE: Multidetector CT imaging of the cervical spine was performed without intravenous contrast. Multiplanar CT image reconstructions were also generated. RADIATION DOSE REDUCTION: This exam was performed according to the departmental dose-optimization program which includes automated exposure control, adjustment of the mA and/or kV according to patient size and/or use of iterative reconstruction technique. COMPARISON:  None Available. FINDINGS: Alignment: No malalignment. Skull base and vertebrae: No regional fracture. Chronic anterior osteophytosis from C2-C7. Soft tissues and spinal canal: No traumatic soft tissue finding. Disc levels: No bony encroachment upon the canal or foramina. No facet arthropathy. Upper chest: Not included. Other: None IMPRESSION: No acute or traumatic finding. Chronic anterior osteophytosis from C2-C7. Wide patency of the canal and foramina. Electronically Signed   By: Nelson Chimes M.D.   On: 06/18/2022 18:23   DG Knee Complete 4 Views Left  Result Date: 06/18/2022 CLINICAL DATA:  Fall, knee pain EXAM: LEFT KNEE - COMPLETE 4+ VIEW COMPARISON:  Report from left knee radiographs dated 07/12/2021 FINDINGS: Mild to moderate articular space narrowing in the medial  compartment with mild marginal spurring. There is also a marginal spurring in the patellofemoral joint. Mild prepatellar subcutaneous edema. Moderate left knee joint effusion in the suprapatellar bursa. No fracture is identified. IMPRESSION: 1. Moderate left knee joint effusion in the suprapatellar bursa. 2. Mild-to-moderate osteoarthritis. 3. Mild prepatellar subcutaneous edema. Electronically Signed   By: Van Clines M.D.   On: 06/18/2022 18:22   CT Head Wo Contrast  Result Date: 06/18/2022 CLINICAL DATA:  [place].  Head trauma. EXAM: CT HEAD WITHOUT CONTRAST TECHNIQUE: Contiguous axial images were obtained from the base of the skull through the vertex without intravenous contrast. RADIATION DOSE REDUCTION: This exam was performed according to the departmental dose-optimization program which includes automated exposure control, adjustment of the mA and/or kV according to patient size and/or use of iterative reconstruction technique. COMPARISON:  None Available. FINDINGS: Brain: Acute subdural hematoma on the right. Largest component is along the lateral convexity measuring up to 11 mm in thickness. Smaller component along the falx measuring 3 mm in thickness. No intraparenchymal or subarachnoid bleeding. Mild mass effect with right-to-left shift of 3 mm. No evidence of pre-existing brain infarction. No hydrocephalus. There appears to be a small chronic subdural in the anterior cranial fossa on the left measuring 4 mm in thickness, much more localized. Vascular: There is atherosclerotic calcification of the  major vessels at the base of the brain. Skull: No skull fracture. Sinuses/Orbits: Clear/normal Other: None IMPRESSION: Acute subdural hematoma on the right. Largest component along the lateral convexity measuring up to 11 mm in thickness. Smaller component along the falx measuring 3 mm in thickness. Mild mass effect with right-to-left shift of 3 mm. Small chronic subdural hematoma in the anterior cranial  fossa on the left measuring only 4 mm. Critical Value/emergent results were called by telephone at the time of interpretation on 06/18/2022 at 6:19 pm to provider Garnette Gunner , who verbally acknowledged these results. Electronically Signed   By: Nelson Chimes M.D.   On: 06/18/2022 18:21   DG Knee Complete 4 Views Right  Result Date: 06/18/2022 CLINICAL DATA:  Fall, knee pain EXAM: RIGHT KNEE - COMPLETE 4+ VIEW COMPARISON:  07/12/2021 report and images from 09/19/2012 FINDINGS: Mild medial compartmental articular space narrowing. Prepatellar soft tissue swelling likely from infiltrative edema, a component of prepatellar bursitis is not excluded. Indistinctness of the spurring and calcifications at the quadriceps attachment site, questionable expansion of the distal quadriceps which could be from tendinopathy. Partial fragmentation of the quadriceps spurring is age indeterminate. No significant additional bony findings. IMPRESSION: 1. Prepatellar soft tissue swelling likely from infiltrative edema, a component of prepatellar bursitis is not excluded. 2. Partial fragmentation of the quadriceps enthesophyte at the attachment site, with questionable expansion of the distal quadriceps. This could be from tendinopathy. Partial fragmentation of the quadriceps spurring is age indeterminate. Electronically Signed   By: Van Clines M.D.   On: 06/18/2022 18:20   DG Elbow Complete Left  Result Date: 06/18/2022 CLINICAL DATA:  Fall, elbow pain EXAM: LEFT ELBOW - COMPLETE 3+ VIEW COMPARISON:  None Available. FINDINGS: No fracture or joint effusion identified. Spurring at the triceps attachment to the olecranon. Potentially some mild subcutaneous edema posterior to the distal humerus. IMPRESSION: 1. No fracture or joint effusion identified. 2. Potential mild subcutaneous edema posterior to the distal humerus. Electronically Signed   By: Van Clines M.D.   On: 06/18/2022 18:17   DG Shoulder Left  Result  Date: 06/18/2022 CLINICAL DATA:  Status post fall. EXAM: LEFT SHOULDER - 2+ VIEW COMPARISON:  None Available. FINDINGS: There is no evidence of fracture or dislocation. Moderate severity degenerative changes are seen involving the left acromioclavicular joint and left glenohumeral articulation. Soft tissues are unremarkable. IMPRESSION: Moderate severity degenerative changes without evidence of acute osseous abnormality. Electronically Signed   By: Virgina Norfolk M.D.   On: 06/18/2022 18:15     Subjective: No acute issues/events overnight   Discharge Exam: Vitals:   06/21/22 0809 06/21/22 1144  BP: 126/81 (!) 142/75  Pulse: 82 74  Resp: 17 16  Temp: 98.8 F (37.1 C) 98.4 F (36.9 C)  SpO2: 95% 95%   Vitals:   06/21/22 0000 06/21/22 0400 06/21/22 0809 06/21/22 1144  BP: (!) 149/71 138/74 126/81 (!) 142/75  Pulse: 92 88 82 74  Resp: '17 20 17 16  '$ Temp: 98.7 F (37.1 C) 98.2 F (36.8 C) 98.8 F (37.1 C) 98.4 F (36.9 C)  TempSrc: Axillary Axillary Oral Oral  SpO2: 98% 97% 95% 95%  Weight:      Height:        General: Pt is alert, awake, not in acute distress Cardiovascular: RRR, S1/S2 +, no rubs, no gallops Respiratory: CTA bilaterally, no wheezing, no rhonchi Abdominal: Soft, NT, ND, bowel sounds + Extremities: no edema, no cyanosis Skin: Laceration, left forehead, left elbow and  bilateral knee skin abrasions.  The results of significant diagnostics from this hospitalization (including imaging, microbiology, ancillary and laboratory) are listed below for reference.    Microbiology: Recent Results (from the past 240 hour(s))  MRSA Next Gen by PCR, Nasal     Status: None   Collection Time: 06/19/22  1:41 AM   Specimen: Nasal Mucosa; Nasal Swab  Result Value Ref Range Status   MRSA by PCR Next Gen NOT DETECTED NOT DETECTED Final    Comment: (NOTE) The GeneXpert MRSA Assay (FDA approved for NASAL specimens only), is one component of a comprehensive MRSA colonization  surveillance program. It is not intended to diagnose MRSA infection nor to guide or monitor treatment for MRSA infections. Test performance is not FDA approved in patients less than 28 years old. Performed at [facility], [place] [address], [place], [place] [address]     Labs: BNP (last 3 results) No results for input(s): "BNP" in the last 8760 hours.  Basic Metabolic Panel: Recent Labs  Lab 06/18/22 1740 06/19/22 0548 06/20/22 0806 06/21/22 0428  NA 145 141 140 142  K 3.3* 3.0* 3.4* 3.3*  CL 105 107 109 107  CO2 '26 24 24 27  '$ GLUCOSE 121* 85 84 104*  BUN '22 19 16 16  '$ CREATININE 1.53* 1.27* 1.13 1.12  CALCIUM 9.3 8.0* 7.9* 8.4*   Liver Function Tests: Recent Labs  Lab 06/18/22 1740  AST 192*  ALT 72*  ALKPHOS 62  BILITOT 2.4*  PROT 7.1  ALBUMIN 4.2   No results for input(s): "LIPASE", "AMYLASE" in the last 168 hours. No results for input(s): "AMMONIA" in the last 168 hours.  CBC: Recent Labs  Lab 06/18/22 1740 06/19/22 0548 06/20/22 0806  WBC 13.5* 8.9 7.2  NEUTROABS 10.9*  --   --   HGB 15.9 12.4* 11.9*  HCT 46.2 34.9* 34.4*  MCV 90.1 90.2 92.5  PLT 265 189 179   Cardiac Enzymes: Recent Labs  Lab 06/18/22 1740 06/19/22 0548 06/20/22 1006 06/20/22 2134 06/21/22 0944  CKTOTAL 7,958* 3,640* 1,220* 1,745* 1,023*   BNP: Invalid input(s): "POCBNP" CBG: No results for input(s): "GLUCAP" in the last 168 hours. D-Dimer No results for input(s): "DDIMER" in the last 72 hours. Hgb A1c No results for input(s): "HGBA1C" in the last 72 hours. Lipid Profile No results for input(s): "CHOL", "HDL", "LDLCALC", "TRIG", "CHOLHDL", "LDLDIRECT" in the last 72 hours. Thyroid function studies No results for input(s): "TSH", "T4TOTAL", "T3FREE", "THYROIDAB" in the last 72 hours.  Invalid input(s): "FREET3" Anemia work up No results for input(s): "VITAMINB12", "FOLATE", "FERRITIN", "TIBC", "IRON", "RETICCTPCT" in the last 72 hours. Urinalysis    Component  Value Date/Time   COLORURINE YELLOW 06/18/2022 2231   APPEARANCEUR HAZY (A) 06/18/2022 2231   LABSPEC 1.027 06/18/2022 2231   PHURINE 5.0 06/18/2022 2231   GLUCOSEU NEGATIVE 06/18/2022 2231   HGBUR MODERATE (A) 06/18/2022 2231   BILIRUBINUR NEGATIVE 06/18/2022 2231   KETONESUR 20 (A) 06/18/2022 2231   PROTEINUR 100 (A) 06/18/2022 2231   NITRITE NEGATIVE 06/18/2022 2231   LEUKOCYTESUR NEGATIVE 06/18/2022 2231   Sepsis Labs Recent Labs  Lab 06/18/22 1740 06/19/22 0548 06/20/22 0806  WBC 13.5* 8.9 7.2   Microbiology Recent Results (from the past 240 hour(s))  MRSA Next Gen by PCR, Nasal     Status: None   Collection Time: 06/19/22  1:41 AM   Specimen: Nasal Mucosa; Nasal Swab  Result Value Ref Range Status   MRSA by PCR Next Gen NOT  DETECTED NOT DETECTED Final    Comment: (NOTE) The GeneXpert MRSA Assay (FDA approved for NASAL specimens only), is one component of a comprehensive MRSA colonization surveillance program. It is not intended to diagnose MRSA infection nor to guide or monitor treatment for MRSA infections. Test performance is not FDA approved in patients less than 73 years old. Performed at [facility], [place] [address], [place], [place] [address]      Time coordinating discharge: Over 30 minutes  SIGNED:   Little Ishikawa, DO [facility] 06/21/2022, 6:38 PM Pager   If 7PM-7AM, please contact night-coverage [email or web address]

## 2022-06-21 NOTE — Plan of Care (Signed)

## 2022-06-21 NOTE — Progress Notes (Signed)
Occupational Therapy Treatment and Discharge Patient Details Name: Grant Martinez MRN: [id number] DOB: 04-06-1934 Today's Date: 06/21/2022   History of present illness 88 yo male [facility] with fall, slurred speech, R facial droop. CT C-spine negative for fx. MRI on 10/4 shows Acute subdural blood over the right cerebral convexity and layering along the falx measuring up to 9 mm in thickness with mild mass effect and 3 mm leftward midline shift, smaller left cerebral convexity subdural collection measuring up to 3 mm without definite acute blood products, probable small volume SAH over L parietal lobe. R knee XR shows prepatellar soft tissue swelling, partial fragmentation of quadriceps enthesophyte at the attachment site, could be tendinopathy. L knee XR shows Moderate left knee joint effusion in the suprapatellar bursa. PMH includes hypertension, hyperlipidemia, prostate cancer.   OT comments  This 88 yo male seen today to go over LUE exercises with theraband, theraputty , and hand exerciser. Pt doing well with these and has niece to cue him if need. Pt to D/C home today so will D/C from acute OT.   Recommendations for follow up therapy are one component of a multi-disciplinary discharge planning process, led by the attending physician.  Recommendations may be updated based on patient status, additional functional criteria and insurance authorization.    Follow Up Recommendations  Home health OT    Assistance Recommended at Discharge Frequent or constant Supervision/Assistance  Patient can return home with the following  A little help with walking and/or transfers;A little help with bathing/dressing/bathroom;Help with stairs or ramp for entrance;Assistance with cooking/housework;Assist for transportation;Direct supervision/assist for financial management;Direct supervision/assist for medications management   Equipment Recommendations  BSC/3in1       Precautions / Restrictions  Precautions Precautions: Fall Restrictions Weight Bearing Restrictions: No                 Extremity/Trunk Assessment Upper Extremity Assessment LUE Deficits / Details: continues to be weak (hand and shoulder) compared to RUE             Cognition Arousal/Alertness: Awake/alert Behavior During Therapy: WFL for tasks assessed/performed, Flat affect Overall Cognitive Status: Impaired/Different from baseline Area of Impairment: Following commands                       Following Commands: Follows one step commands inconsistently                Exercises Other Exercises Other Exercises: Educated pt and niece on level 1 theraband exercises, level 2 theraputty exercises, and hand exerciser. Pt returned demonstrated exercises and has niece that will faciliate them at home with him. Start out wtih 3 times a day 5 reps and build up to 5x/day 10 reps            Pertinent Vitals/ Pain       Pain Assessment Pain Assessment: No/denies pain         Frequency  Min 2X/week        Progress Toward Goals  OT Goals(current goals can now be found in the care plan section)  Progress towards OT goals: Progressing toward goals  Acute Rehab OT Goals Patient Stated Goal: to go home today OT Goal Formulation: With patient/family Time For Goal Achievement: 07/04/22 Potential to Achieve Goals: Good  Plan Discharge plan remains appropriate       AM-PAC OT "6 Clicks" Daily Activity     Outcome Measure   Help from another person eating  meals?: None Help from another person taking care of personal grooming?: A Little Help from another person toileting, which includes using toliet, bedpan, or urinal?: A Little Help from another person bathing (including washing, rinsing, drying)?: A Little Help from another person to put on and taking off regular upper body clothing?: A Little Help from another person to put on and taking off regular lower body clothing?: A  Little 6 Click Score: 19    End of Session    OT Visit Diagnosis: Unsteadiness on feet (R26.81);Muscle weakness (generalized) (M62.81);Other symptoms and signs involving cognitive function   Activity Tolerance Patient tolerated treatment well   Patient Left in bed;with call bell/phone within reach;with bed alarm set;with family/visitor present           Time: 1423-9532 OT Time Calculation (min): 15 min  Charges: OT General Charges $OT Visit: 1 Visit OT Treatments $Therapeutic Exercise: 8-22 mins  [place], OTR/L Acute Rehab Services [facility] [phone] Office [phone]    Almon Register 06/21/2022, 1:31 PM

## 2022-06-21 NOTE — Care Management Important Message (Signed)
Important Message  Patient Details  Name: Grant Martinez MRN: [id number] Date of Birth: 1934-07-08   Medicare Important Message Given:  Yes     Amiria Orrison 06/21/2022, 4:20 PM

## 2022-06-27 DIAGNOSIS — M1000 Idiopathic gout, unspecified site: Secondary | ICD-10-CM | POA: Diagnosis not present

## 2022-06-27 DIAGNOSIS — I10 Essential (primary) hypertension: Secondary | ICD-10-CM | POA: Diagnosis not present

## 2022-06-27 DIAGNOSIS — R972 Elevated prostate specific antigen [PSA]: Secondary | ICD-10-CM | POA: Diagnosis not present

## 2022-06-27 DIAGNOSIS — E7849 Other hyperlipidemia: Secondary | ICD-10-CM | POA: Diagnosis not present

## 2022-06-27 DIAGNOSIS — R7303 Prediabetes: Secondary | ICD-10-CM | POA: Diagnosis not present

## 2022-06-27 DIAGNOSIS — N183 Chronic kidney disease, stage 3 unspecified: Secondary | ICD-10-CM | POA: Diagnosis not present

## 2022-06-27 DIAGNOSIS — E039 Hypothyroidism, unspecified: Secondary | ICD-10-CM | POA: Diagnosis not present

## 2022-06-27 DIAGNOSIS — R739 Hyperglycemia, unspecified: Secondary | ICD-10-CM | POA: Diagnosis not present

## 2022-06-27 DIAGNOSIS — E559 Vitamin D deficiency, unspecified: Secondary | ICD-10-CM | POA: Diagnosis not present

## 2022-06-28 DIAGNOSIS — S5001XA Contusion of right elbow, initial encounter: Secondary | ICD-10-CM | POA: Diagnosis not present

## 2022-06-28 DIAGNOSIS — R748 Abnormal levels of other serum enzymes: Secondary | ICD-10-CM | POA: Diagnosis not present

## 2022-06-28 DIAGNOSIS — Z6834 Body mass index (BMI) 34.0-34.9, adult: Secondary | ICD-10-CM | POA: Diagnosis not present

## 2022-07-05 DIAGNOSIS — S065XAA Traumatic subdural hemorrhage with loss of consciousness status unknown, initial encounter: Secondary | ICD-10-CM | POA: Diagnosis not present

## 2022-07-10 ENCOUNTER — Other Ambulatory Visit (HOSPITAL_COMMUNITY): Payer: Self-pay | Admitting: Neurosurgery

## 2022-07-10 DIAGNOSIS — S065XAA Traumatic subdural hemorrhage with loss of consciousness status unknown, initial encounter: Secondary | ICD-10-CM

## 2022-07-11 DIAGNOSIS — S065XAA Traumatic subdural hemorrhage with loss of consciousness status unknown, initial encounter: Secondary | ICD-10-CM | POA: Diagnosis not present

## 2022-07-11 DIAGNOSIS — M6282 Rhabdomyolysis: Secondary | ICD-10-CM | POA: Diagnosis not present

## 2022-07-11 DIAGNOSIS — N183 Chronic kidney disease, stage 3 unspecified: Secondary | ICD-10-CM | POA: Diagnosis not present

## 2022-07-11 DIAGNOSIS — S5001XD Contusion of right elbow, subsequent encounter: Secondary | ICD-10-CM | POA: Diagnosis not present

## 2022-07-11 DIAGNOSIS — Z6831 Body mass index (BMI) 31.0-31.9, adult: Secondary | ICD-10-CM | POA: Diagnosis not present

## 2022-07-11 DIAGNOSIS — S0181XD Laceration without foreign body of other part of head, subsequent encounter: Secondary | ICD-10-CM | POA: Diagnosis not present

## 2022-07-11 DIAGNOSIS — S40011A Contusion of right shoulder, initial encounter: Secondary | ICD-10-CM | POA: Diagnosis not present

## 2022-07-11 DIAGNOSIS — M109 Gout, unspecified: Secondary | ICD-10-CM | POA: Diagnosis not present

## 2022-07-11 DIAGNOSIS — N179 Acute kidney failure, unspecified: Secondary | ICD-10-CM | POA: Diagnosis not present

## 2022-07-21 DIAGNOSIS — R7989 Other specified abnormal findings of blood chemistry: Secondary | ICD-10-CM | POA: Diagnosis not present

## 2022-07-21 DIAGNOSIS — N183 Chronic kidney disease, stage 3 unspecified: Secondary | ICD-10-CM | POA: Diagnosis not present

## 2022-07-21 DIAGNOSIS — S0181XA Laceration without foreign body of other part of head, initial encounter: Secondary | ICD-10-CM | POA: Diagnosis not present

## 2022-07-21 DIAGNOSIS — C61 Malignant neoplasm of prostate: Secondary | ICD-10-CM | POA: Diagnosis not present

## 2022-07-21 DIAGNOSIS — M6282 Rhabdomyolysis: Secondary | ICD-10-CM | POA: Diagnosis not present

## 2022-07-21 DIAGNOSIS — I6203 Nontraumatic chronic subdural hemorrhage: Secondary | ICD-10-CM | POA: Diagnosis not present

## 2022-07-21 DIAGNOSIS — N179 Acute kidney failure, unspecified: Secondary | ICD-10-CM | POA: Diagnosis not present

## 2022-07-21 DIAGNOSIS — M170 Bilateral primary osteoarthritis of knee: Secondary | ICD-10-CM | POA: Diagnosis not present

## 2022-07-21 DIAGNOSIS — S065XAA Traumatic subdural hemorrhage with loss of consciousness status unknown, initial encounter: Secondary | ICD-10-CM | POA: Diagnosis not present

## 2022-07-21 DIAGNOSIS — S5001XA Contusion of right elbow, initial encounter: Secondary | ICD-10-CM | POA: Diagnosis not present

## 2022-08-05 DIAGNOSIS — S065XAA Traumatic subdural hemorrhage with loss of consciousness status unknown, initial encounter: Secondary | ICD-10-CM | POA: Diagnosis not present

## 2022-08-05 DIAGNOSIS — N179 Acute kidney failure, unspecified: Secondary | ICD-10-CM | POA: Diagnosis not present

## 2022-08-05 DIAGNOSIS — M170 Bilateral primary osteoarthritis of knee: Secondary | ICD-10-CM | POA: Diagnosis not present

## 2022-08-05 DIAGNOSIS — I6203 Nontraumatic chronic subdural hemorrhage: Secondary | ICD-10-CM | POA: Diagnosis not present

## 2022-08-05 DIAGNOSIS — R059 Cough, unspecified: Secondary | ICD-10-CM | POA: Diagnosis not present

## 2022-08-05 DIAGNOSIS — M6282 Rhabdomyolysis: Secondary | ICD-10-CM | POA: Diagnosis not present

## 2022-08-05 DIAGNOSIS — C61 Malignant neoplasm of prostate: Secondary | ICD-10-CM | POA: Diagnosis not present

## 2022-08-05 DIAGNOSIS — R7989 Other specified abnormal findings of blood chemistry: Secondary | ICD-10-CM | POA: Diagnosis not present

## 2022-08-05 DIAGNOSIS — S0181XA Laceration without foreign body of other part of head, initial encounter: Secondary | ICD-10-CM | POA: Diagnosis not present

## 2022-08-05 DIAGNOSIS — S5001XA Contusion of right elbow, initial encounter: Secondary | ICD-10-CM | POA: Diagnosis not present

## 2022-08-06 ENCOUNTER — Ambulatory Visit (HOSPITAL_COMMUNITY)
Admission: RE | Admit: 2022-08-06 | Discharge: 2022-08-06 | Disposition: A | Discharge status: Home / Self Care | Payer: Medicare HMO | Source: Ambulatory Visit | Attending: Neurosurgery | Admitting: Neurosurgery

## 2022-08-06 DIAGNOSIS — S065XAA Traumatic subdural hemorrhage with loss of consciousness status unknown, initial encounter: Secondary | ICD-10-CM | POA: Insufficient documentation

## 2022-08-06 DIAGNOSIS — I6200 Nontraumatic subdural hemorrhage, unspecified: Secondary | ICD-10-CM | POA: Diagnosis not present

## 2022-08-12 DIAGNOSIS — S065XAA Traumatic subdural hemorrhage with loss of consciousness status unknown, initial encounter: Secondary | ICD-10-CM | POA: Diagnosis not present

## 2022-08-28 DIAGNOSIS — R9721 Rising PSA following treatment for malignant neoplasm of prostate: Secondary | ICD-10-CM | POA: Diagnosis not present

## 2022-09-02 DIAGNOSIS — C61 Malignant neoplasm of prostate: Secondary | ICD-10-CM | POA: Diagnosis not present

## 2022-09-24 DIAGNOSIS — K080 Exfoliation of teeth due to systemic causes: Secondary | ICD-10-CM | POA: Diagnosis not present

## 2022-10-07 DIAGNOSIS — R54 Age-related physical debility: Secondary | ICD-10-CM | POA: Diagnosis not present

## 2022-10-07 DIAGNOSIS — Y92410 Unspecified street and highway as the place of occurrence of the external cause: Secondary | ICD-10-CM | POA: Diagnosis not present

## 2022-10-07 DIAGNOSIS — M545 Low back pain, unspecified: Secondary | ICD-10-CM | POA: Diagnosis not present

## 2022-10-07 DIAGNOSIS — J9811 Atelectasis: Secondary | ICD-10-CM | POA: Diagnosis not present

## 2022-10-07 DIAGNOSIS — C61 Malignant neoplasm of prostate: Secondary | ICD-10-CM | POA: Diagnosis not present

## 2022-10-07 DIAGNOSIS — M5459 Other low back pain: Secondary | ICD-10-CM | POA: Diagnosis not present

## 2022-10-07 DIAGNOSIS — Z9989 Dependence on other enabling machines and devices: Secondary | ICD-10-CM | POA: Diagnosis not present

## 2022-10-07 DIAGNOSIS — K8020 Calculus of gallbladder without cholecystitis without obstruction: Secondary | ICD-10-CM | POA: Diagnosis not present

## 2022-10-07 DIAGNOSIS — K4090 Unilateral inguinal hernia, without obstruction or gangrene, not specified as recurrent: Secondary | ICD-10-CM | POA: Diagnosis not present

## 2022-10-07 DIAGNOSIS — H9190 Unspecified hearing loss, unspecified ear: Secondary | ICD-10-CM | POA: Diagnosis not present

## 2022-10-07 DIAGNOSIS — M79671 Pain in right foot: Secondary | ICD-10-CM | POA: Diagnosis not present

## 2022-10-07 DIAGNOSIS — R079 Chest pain, unspecified: Secondary | ICD-10-CM | POA: Diagnosis not present

## 2022-10-07 DIAGNOSIS — S80812A Abrasion, left lower leg, initial encounter: Secondary | ICD-10-CM | POA: Diagnosis not present

## 2022-10-07 DIAGNOSIS — R402431 Glasgow coma scale score 3-8, in the field [EMT or ambulance]: Secondary | ICD-10-CM | POA: Diagnosis not present

## 2022-10-07 DIAGNOSIS — M549 Dorsalgia, unspecified: Secondary | ICD-10-CM | POA: Diagnosis not present

## 2022-10-07 DIAGNOSIS — M79662 Pain in left lower leg: Secondary | ICD-10-CM | POA: Diagnosis not present

## 2022-10-07 DIAGNOSIS — R413 Other amnesia: Secondary | ICD-10-CM | POA: Diagnosis not present

## 2022-10-07 DIAGNOSIS — Z79818 Long term (current) use of other agents affecting estrogen receptors and estrogen levels: Secondary | ICD-10-CM | POA: Diagnosis not present

## 2022-10-07 DIAGNOSIS — I169 Hypertensive crisis, unspecified: Secondary | ICD-10-CM | POA: Diagnosis not present

## 2022-10-07 DIAGNOSIS — G9340 Encephalopathy, unspecified: Secondary | ICD-10-CM | POA: Diagnosis not present

## 2022-10-07 DIAGNOSIS — M109 Gout, unspecified: Secondary | ICD-10-CM | POA: Diagnosis not present

## 2022-10-07 DIAGNOSIS — Z818 Family history of other mental and behavioral disorders: Secondary | ICD-10-CM | POA: Diagnosis not present

## 2022-10-07 DIAGNOSIS — M546 Pain in thoracic spine: Secondary | ICD-10-CM | POA: Diagnosis not present

## 2022-10-07 DIAGNOSIS — M542 Cervicalgia: Secondary | ICD-10-CM | POA: Diagnosis not present

## 2022-10-07 DIAGNOSIS — I10 Essential (primary) hypertension: Secondary | ICD-10-CM | POA: Diagnosis not present

## 2022-10-07 DIAGNOSIS — R296 Repeated falls: Secondary | ICD-10-CM | POA: Diagnosis not present

## 2022-10-07 DIAGNOSIS — E785 Hyperlipidemia, unspecified: Secondary | ICD-10-CM | POA: Diagnosis not present

## 2022-10-07 DIAGNOSIS — S065X0A Traumatic subdural hemorrhage without loss of consciousness, initial encounter: Secondary | ICD-10-CM | POA: Diagnosis not present

## 2022-10-07 DIAGNOSIS — S8992XA Unspecified injury of left lower leg, initial encounter: Secondary | ICD-10-CM | POA: Diagnosis not present

## 2022-10-07 DIAGNOSIS — Z8546 Personal history of malignant neoplasm of prostate: Secondary | ICD-10-CM | POA: Diagnosis not present

## 2022-10-07 DIAGNOSIS — G8929 Other chronic pain: Secondary | ICD-10-CM | POA: Diagnosis not present

## 2022-10-07 DIAGNOSIS — S065XAA Traumatic subdural hemorrhage with loss of consciousness status unknown, initial encounter: Secondary | ICD-10-CM | POA: Diagnosis not present

## 2022-10-07 DIAGNOSIS — Z041 Encounter for examination and observation following transport accident: Secondary | ICD-10-CM | POA: Diagnosis not present

## 2022-10-08 DIAGNOSIS — S065X0A Traumatic subdural hemorrhage without loss of consciousness, initial encounter: Secondary | ICD-10-CM | POA: Diagnosis not present

## 2022-10-08 DIAGNOSIS — I444 Left anterior fascicular block: Secondary | ICD-10-CM | POA: Diagnosis not present

## 2022-10-08 DIAGNOSIS — R9431 Abnormal electrocardiogram [ECG] [EKG]: Secondary | ICD-10-CM | POA: Diagnosis not present

## 2022-10-08 DIAGNOSIS — R400 Somnolence: Secondary | ICD-10-CM | POA: Diagnosis not present

## 2022-10-08 DIAGNOSIS — Z041 Encounter for examination and observation following transport accident: Secondary | ICD-10-CM | POA: Diagnosis not present

## 2022-10-08 DIAGNOSIS — M79671 Pain in right foot: Secondary | ICD-10-CM | POA: Diagnosis not present

## 2022-10-08 DIAGNOSIS — S065XAA Traumatic subdural hemorrhage with loss of consciousness status unknown, initial encounter: Secondary | ICD-10-CM | POA: Diagnosis not present

## 2022-10-08 DIAGNOSIS — I371 Nonrheumatic pulmonary valve insufficiency: Secondary | ICD-10-CM | POA: Diagnosis not present

## 2022-10-08 DIAGNOSIS — I6200 Nontraumatic subdural hemorrhage, unspecified: Secondary | ICD-10-CM | POA: Diagnosis not present

## 2022-10-08 DIAGNOSIS — R413 Other amnesia: Secondary | ICD-10-CM | POA: Diagnosis not present

## 2022-10-08 DIAGNOSIS — R296 Repeated falls: Secondary | ICD-10-CM | POA: Diagnosis not present

## 2022-10-08 DIAGNOSIS — M549 Dorsalgia, unspecified: Secondary | ICD-10-CM | POA: Diagnosis not present

## 2022-10-08 DIAGNOSIS — Z7189 Other specified counseling: Secondary | ICD-10-CM | POA: Diagnosis not present

## 2022-10-08 DIAGNOSIS — G9340 Encephalopathy, unspecified: Secondary | ICD-10-CM | POA: Diagnosis not present

## 2022-10-08 DIAGNOSIS — I517 Cardiomegaly: Secondary | ICD-10-CM | POA: Diagnosis not present

## 2022-10-14 DIAGNOSIS — S065XAA Traumatic subdural hemorrhage with loss of consciousness status unknown, initial encounter: Secondary | ICD-10-CM | POA: Diagnosis not present

## 2022-10-14 DIAGNOSIS — K222 Esophageal obstruction: Secondary | ICD-10-CM | POA: Diagnosis not present

## 2022-10-14 DIAGNOSIS — D1779 Benign lipomatous neoplasm of other sites: Secondary | ICD-10-CM | POA: Diagnosis not present

## 2022-10-14 DIAGNOSIS — S81812A Laceration without foreign body, left lower leg, initial encounter: Secondary | ICD-10-CM | POA: Diagnosis not present

## 2022-10-14 DIAGNOSIS — N1831 Chronic kidney disease, stage 3a: Secondary | ICD-10-CM | POA: Diagnosis not present

## 2022-10-15 ENCOUNTER — Encounter: Payer: Self-pay | Admitting: Internal Medicine

## 2022-10-23 DIAGNOSIS — L609 Nail disorder, unspecified: Secondary | ICD-10-CM | POA: Diagnosis not present

## 2022-10-23 DIAGNOSIS — M79671 Pain in right foot: Secondary | ICD-10-CM | POA: Diagnosis not present

## 2022-10-23 DIAGNOSIS — M79672 Pain in left foot: Secondary | ICD-10-CM | POA: Diagnosis not present

## 2022-10-23 DIAGNOSIS — I739 Peripheral vascular disease, unspecified: Secondary | ICD-10-CM | POA: Diagnosis not present

## 2022-10-29 DIAGNOSIS — I10 Essential (primary) hypertension: Secondary | ICD-10-CM | POA: Diagnosis not present

## 2022-10-29 DIAGNOSIS — N1831 Chronic kidney disease, stage 3a: Secondary | ICD-10-CM | POA: Diagnosis not present

## 2022-11-04 DIAGNOSIS — I6200 Nontraumatic subdural hemorrhage, unspecified: Secondary | ICD-10-CM | POA: Diagnosis not present

## 2022-11-04 DIAGNOSIS — I6203 Nontraumatic chronic subdural hemorrhage: Secondary | ICD-10-CM | POA: Diagnosis not present

## 2022-11-04 DIAGNOSIS — D181 Lymphangioma, any site: Secondary | ICD-10-CM | POA: Diagnosis not present

## 2022-11-04 DIAGNOSIS — I6789 Other cerebrovascular disease: Secondary | ICD-10-CM | POA: Diagnosis not present

## 2022-11-05 DIAGNOSIS — R296 Repeated falls: Secondary | ICD-10-CM | POA: Diagnosis not present

## 2022-11-05 DIAGNOSIS — S065XAA Traumatic subdural hemorrhage with loss of consciousness status unknown, initial encounter: Secondary | ICD-10-CM | POA: Diagnosis not present

## 2022-11-05 DIAGNOSIS — H6123 Impacted cerumen, bilateral: Secondary | ICD-10-CM | POA: Diagnosis not present

## 2022-11-05 DIAGNOSIS — D1779 Benign lipomatous neoplasm of other sites: Secondary | ICD-10-CM | POA: Diagnosis not present

## 2022-11-05 DIAGNOSIS — N1831 Chronic kidney disease, stage 3a: Secondary | ICD-10-CM | POA: Diagnosis not present

## 2022-11-11 DIAGNOSIS — S065XAA Traumatic subdural hemorrhage with loss of consciousness status unknown, initial encounter: Secondary | ICD-10-CM | POA: Diagnosis not present

## 2022-11-11 NOTE — H&P (View-Only) (Signed)
Referring Provider: Curlene Labrum, MD Primary Care Physician:  Curlene Labrum, MD Primary Gastroenterologist:  Dr. Gala Romney  Chief Complaint  Patient presents with   Dysphagia    Having trouble swallowing.     HPI:   Grant Martinez is a 89 y.o. male with history of gout, HLD, HTN, hypothyroidism, prostate cancer, presenting today at the request of Dr. Pleas Koch for esophageal stricture.  Reviewed office visit with PCP 10/14/2022.  Patient reported sensation of food getting hung or stuck, getting choked.  Recommended seeing GI for further evaluation.  Today:  Presents with his niece.  Sensation of foods getting hung in esophagus at sternal notch. Drinks something to push items down. Started several months ago, going on 1 year. Worsening. Bringing food back up at times.  Worse with solid foods, but has also occurred with soft foods and pills.  Sometimes feels liquids going down the wrong way. No reflux. No nausea or vomiting.  Rare use of ibuprofen. No odynophagia. Had some loss of appetite around October last year when he fell and had a brain bleed, but this is improving.  He has lost some weight, but reports he is now gaining weight again.  He was down to 177 pounds, now back up to 183 pounds.    No brbpr or melena.  Has 1 mushy bowel movement daily.  Colonoscopy in [place] of years ago. Polyps in the past, but doesn't think he had any on the most recent. No prior EGD.     Past Medical History:  Diagnosis Date   Gout    Hyperlipemia    Hypertension    Prostate cancer ([place])    Thyroid disease     History reviewed. No pertinent surgical history.  Current Outpatient Medications  Medication Sig Dispense Refill   Cholecalciferol (VITAMIN D3) 125 MCG (5000 UT) CAPS Take 1 capsule by mouth daily.     colchicine 0.6 MG tablet Take 0.6 mg by mouth daily as needed (gout flare-ups).     diclofenac Sodium (VOLTAREN) 1 % GEL Apply 2 g topically 4 (four) times daily.     Leuprolide  Acetate, 3 Month, (ELIGARD) 22.5 MG injection Inject 22.5 mg into the skin every 3 (three) months.     levothyroxine (SYNTHROID) 50 MCG tablet Take 50 mcg by mouth daily.     Multiple Vitamin (MULTIVITAMIN) tablet Take 1 tablet by mouth daily.     OVER THE COUNTER MEDICATION Blue emu     pravastatin (PRAVACHOL) 20 MG tablet Take by mouth.     atorvastatin (LIPITOR) 10 MG tablet Take 1 tablet by mouth daily. (Patient not taking: Reported on 11/13/2022)     levETIRAcetam (KEPPRA) 500 MG tablet Take 1 tablet (500 mg total) by mouth 2 (two) times daily. (Patient not taking: Reported on 11/13/2022) 9 tablet 0   No current facility-administered medications for this visit.    Allergies as of 11/13/2022   (No Known Allergies)    History reviewed. No pertinent family history.  Social History   Socioeconomic History   Marital status: Single    Spouse name: Not on file   Number of children: Not on file   Years of education: Not on file   Highest education level: Not on file  Occupational History   Not on file  Tobacco Use   Smoking status: Never   Smokeless tobacco: Never  Substance and Sexual Activity   Alcohol use: No   Drug use: Never   Sexual activity:  Not on file  Other Topics Concern   Not on file  Social History Narrative   Not on file   Social Determinants of Health   Financial Resource Strain: Not on file  Food Insecurity: No Food Insecurity (06/19/2022)   Hunger Vital Sign    Worried About Running Out of Food in the Last Year: Never true    Ran Out of Food in the Last Year: Never true  Transportation Needs: No Transportation Needs (06/19/2022)   PRAPARE - Hydrologist (Medical): No    Lack of Transportation (Non-Medical): No  Physical Activity: Not on file  Stress: Not on file  Social Connections: Not on file  Intimate Partner Violence: Not At Risk (06/19/2022)   Humiliation, Afraid, Rape, and Kick questionnaire    Fear of Current or  Ex-Partner: No    Emotionally Abused: No    Physically Abused: No    Sexually Abused: No    Review of Systems: Gen: Denies any fever, chills, cold or flulike symptoms, presyncope, syncope. CV: Denies chest pain, heart palpitations. Resp: Denies shortness of breath, cough. GI: See HPI GU : Denies urinary burning, urinary frequency, urinary hesitancy MS: Denies joint pain. Derm: Denies rash. Psych: Denies depression, anxiety. Heme: See HPI  Physical Exam: BP 134/78 (BP Location: Left Arm, Patient Position: Sitting, Cuff Size: Large)   Pulse 88   Temp 97.7 F (36.5 C) (Temporal)   Ht 5\' 8"  (1.727 m)   Wt 183 lb 9.6 oz (83.3 kg)   SpO2 96%   BMI 27.92 kg/m  General:   Elderly gentleman, using a cane, weakness and left leg, alert, pleasant, cooperative, well-nourished and well-developed.  Head:  Normocephalic and atraumatic. Eyes:  Without icterus, sclera clear and conjunctiva pink.  Ears:  Normal auditory acuity. Lungs:  Clear to auscultation bilaterally. No wheezes, rales, or rhonchi. No distress.  Heart:  S1, S2 present without murmurs appreciated.  Abdomen:  +BS, soft, non-tender and non-distended. No HSM noted. No guarding or rebound. No masses appreciated.  Rectal:  Deferred  Msk:  Symmetrical without gross deformities. Normal posture. Extremities:  Without edema. Neurologic:  Alert and  oriented x4;  grossly normal neurologically. Skin:  Intact without significant lesions or rashes. Psych:  Normal mood and affect.    Assessment:  89 year old male with history of gout, HLD, HTN, hypothyroidism, prostate cancer, subdural hematoma after a fall in October 2023 with almost complete resolution on most recent CT head without contrast 11/04/2022, presenting today at the request of Dr. Pleas Koch for further evaluation of dysphagia.  Dysphagia: Close to 1 year history of sensation of foods and pills getting stuck in his esophagus at the sternal notch.  Symptoms have been occurring  more frequently and have also been associated with intermittent regurgitation.  Also reports occasional sensation of liquids going down the wrong way.  Denies reflux symptoms, odynophagia, BRBPR, melena.  He did have some weight loss following his fall in October with subdural hematoma as he also lost his appetite, but reports his appetite is coming back, and he is now gaining weight again.  No prior EGD.  Differentials include esophageal web, ring, stricture, and unable to rule out malignancy.  He needs EGD for further evaluation.  If he has worsening sensation of liquids going down the wrong way, will need to consider modified barium swallow.   Plan:  Proceed with upper endoscopy with propofol by Dr. Gala Romney in near future. The risks, benefits, and alternatives have  been discussed with the patient in detail. The patient states understanding and desires to proceed.  ASA 3 Dysphagia precautions discussed including ER precautions.  Written instructions provided on AVS. Follow-up after EGD.   Aliene Altes, PA-C [facility] 11/13/2022

## 2022-11-11 NOTE — Progress Notes (Unsigned)
Referring Provider: Curlene Labrum, MD Primary Care Physician:  Curlene Labrum, MD Primary Gastroenterologist:  Dr. Gala Romney  No chief complaint on file.   HPI:   Grant Martinez is a 89 y.o. male with history of gout, HLD, HTN, hypothyroidism, prostate cancer, presenting today at the request of Dr. Pleas Koch for esophageal stricture.  Today:    Past Medical History:  Diagnosis Date   Gout    Hyperlipemia    Hypertension    Prostate cancer ([place])    Thyroid disease     No past surgical history on file.  Current Outpatient Medications  Medication Sig Dispense Refill   atorvastatin (LIPITOR) 10 MG tablet Take 1 tablet by mouth daily.     Cholecalciferol (VITAMIN D3) 125 MCG (5000 UT) CAPS Take 1 capsule by mouth daily.     colchicine 0.6 MG tablet Take 0.6 mg by mouth daily as needed (gout flare-ups).     diclofenac Sodium (VOLTAREN) 1 % GEL Apply 2 g topically 4 (four) times daily.     Leuprolide Acetate, 3 Month, (ELIGARD) 22.5 MG injection Inject 22.5 mg into the skin every 3 (three) months.     levETIRAcetam (KEPPRA) 500 MG tablet Take 1 tablet (500 mg total) by mouth 2 (two) times daily. 9 tablet 0   levothyroxine (SYNTHROID) 50 MCG tablet Take 50 mcg by mouth daily.     Multiple Vitamin (MULTIVITAMIN) tablet Take 1 tablet by mouth daily.     OVER THE COUNTER MEDICATION Blue emu     No current facility-administered medications for this visit.    Allergies as of 11/13/2022   (No Known Allergies)    No family history on file.  Social History   Socioeconomic History   Marital status: Single    Spouse name: Not on file   Number of children: Not on file   Years of education: Not on file   Highest education level: Not on file  Occupational History   Not on file  Tobacco Use   Smoking status: Never   Smokeless tobacco: Never  Substance and Sexual Activity   Alcohol use: No   Drug use: Never   Sexual activity: Not on file  Other Topics Concern   Not on  file  Social History Narrative   Not on file   Social Determinants of Health   Financial Resource Strain: Not on file  Food Insecurity: No Food Insecurity (06/19/2022)   Hunger Vital Sign    Worried About Running Out of Food in the Last Year: Never true    Ran Out of Food in the Last Year: Never true  Transportation Needs: No Transportation Needs (06/19/2022)   PRAPARE - Hydrologist (Medical): No    Lack of Transportation (Non-Medical): No  Physical Activity: Not on file  Stress: Not on file  Social Connections: Not on file  Intimate Partner Violence: Not At Risk (06/19/2022)   Humiliation, Afraid, Rape, and Kick questionnaire    Fear of Current or Ex-Partner: No    Emotionally Abused: No    Physically Abused: No    Sexually Abused: No    Review of Systems: Gen: Denies any fever, chills, fatigue, weight loss, lack of appetite.  CV: Denies chest pain, heart palpitations, peripheral edema, syncope.  Resp: Denies shortness of breath at rest or with exertion. Denies wheezing or cough.  GI: Denies dysphagia or odynophagia. Denies jaundice, hematemesis, fecal incontinence. GU : Denies urinary burning, urinary frequency,  urinary hesitancy MS: Denies joint pain, muscle weakness, cramps, or limitation of movement.  Derm: Denies rash, itching, dry skin Psych: Denies depression, anxiety, memory loss, and confusion Heme: Denies bruising, bleeding, and enlarged lymph nodes.  Physical Exam: There were no vitals taken for this visit. General:   Alert and oriented. Pleasant and cooperative. Well-nourished and well-developed.  Head:  Normocephalic and atraumatic. Eyes:  Without icterus, sclera clear and conjunctiva pink.  Ears:  Normal auditory acuity. Lungs:  Clear to auscultation bilaterally. No wheezes, rales, or rhonchi. No distress.  Heart:  S1, S2 present without murmurs appreciated.  Abdomen:  +BS, soft, non-tender and non-distended. No HSM noted. No  guarding or rebound. No masses appreciated.  Rectal:  Deferred  Msk:  Symmetrical without gross deformities. Normal posture. Extremities:  Without edema. Neurologic:  Alert and  oriented x4;  grossly normal neurologically. Skin:  Intact without significant lesions or rashes. Psych:  Alert and cooperative. Normal mood and affect.    Assessment:     Plan:  ***   Aliene Altes, PA-C [facility]enter[facility] 11/13/2022

## 2022-11-13 ENCOUNTER — Ambulatory Visit: Payer: Medicare Other | Admitting: Gastroenterology

## 2022-11-13 ENCOUNTER — Encounter: Payer: Self-pay | Admitting: Gastroenterology

## 2022-11-13 VITALS — BP 134/78 | HR 88 | Temp 97.7°F | Ht 68.0 in | Wt 183.6 lb

## 2022-11-13 DIAGNOSIS — R1310 Dysphagia, unspecified: Secondary | ICD-10-CM | POA: Diagnosis not present

## 2022-11-13 NOTE — Patient Instructions (Addendum)
We will arrange for you to have an upper endoscopy with possible stretching of your esophagus in the near future with Dr. Gala Romney.  Swallowing precautions:  Eat slowly, take small bites, chew thoroughly, drink plenty of liquids throughout meals.  Avoid trough textures All meats should be chopped finely.  If something gets hung in your esophagus and will not come up or go down, proceed to the emergency room.    Follow-up with your primary care doctor on your elevated blood pressure.   We will follow-up with you in the office after your upper endoscopy.  It was a pleasure to meet you today! I want to create trusting relationships with patients. If you receive a survey regarding your visit,  I greatly appreciate you taking time to fill this out on paper or through your MyChart. I value your feedback.  Aliene Altes, PA-C [facility]enter[facility]

## 2022-11-14 ENCOUNTER — Encounter: Payer: Self-pay | Admitting: *Deleted

## 2022-11-14 DIAGNOSIS — M179 Osteoarthritis of knee, unspecified: Secondary | ICD-10-CM | POA: Diagnosis not present

## 2022-11-14 DIAGNOSIS — R296 Repeated falls: Secondary | ICD-10-CM | POA: Diagnosis not present

## 2022-11-14 DIAGNOSIS — R29898 Other symptoms and signs involving the musculoskeletal system: Secondary | ICD-10-CM | POA: Diagnosis not present

## 2022-11-15 ENCOUNTER — Encounter: Payer: Self-pay | Admitting: *Deleted

## 2022-11-19 DIAGNOSIS — R296 Repeated falls: Secondary | ICD-10-CM | POA: Diagnosis not present

## 2022-11-19 DIAGNOSIS — R29898 Other symptoms and signs involving the musculoskeletal system: Secondary | ICD-10-CM | POA: Diagnosis not present

## 2022-11-19 DIAGNOSIS — M179 Osteoarthritis of knee, unspecified: Secondary | ICD-10-CM | POA: Diagnosis not present

## 2022-11-20 DIAGNOSIS — M179 Osteoarthritis of knee, unspecified: Secondary | ICD-10-CM | POA: Diagnosis not present

## 2022-11-20 DIAGNOSIS — R296 Repeated falls: Secondary | ICD-10-CM | POA: Diagnosis not present

## 2022-11-20 DIAGNOSIS — R29898 Other symptoms and signs involving the musculoskeletal system: Secondary | ICD-10-CM | POA: Diagnosis not present

## 2022-11-23 DIAGNOSIS — M109 Gout, unspecified: Secondary | ICD-10-CM | POA: Diagnosis not present

## 2022-11-26 ENCOUNTER — Emergency Department (HOSPITAL_COMMUNITY): Payer: Medicare Other

## 2022-11-26 ENCOUNTER — Other Ambulatory Visit: Payer: Self-pay

## 2022-11-26 ENCOUNTER — Encounter (HOSPITAL_COMMUNITY): Payer: Self-pay

## 2022-11-26 ENCOUNTER — Emergency Department (HOSPITAL_COMMUNITY)
Admission: EM | Admit: 2022-11-26 | Discharge: 2022-11-26 | Disposition: A | Discharge status: Home / Self Care | Payer: Medicare Other | Attending: Emergency Medicine | Admitting: Emergency Medicine

## 2022-11-26 DIAGNOSIS — R1031 Right lower quadrant pain: Secondary | ICD-10-CM | POA: Diagnosis not present

## 2022-11-26 DIAGNOSIS — K4090 Unilateral inguinal hernia, without obstruction or gangrene, not specified as recurrent: Secondary | ICD-10-CM | POA: Diagnosis not present

## 2022-11-26 DIAGNOSIS — Z8546 Personal history of malignant neoplasm of prostate: Secondary | ICD-10-CM | POA: Diagnosis not present

## 2022-11-26 DIAGNOSIS — N200 Calculus of kidney: Secondary | ICD-10-CM | POA: Diagnosis not present

## 2022-11-26 DIAGNOSIS — N201 Calculus of ureter: Secondary | ICD-10-CM | POA: Diagnosis not present

## 2022-11-26 DIAGNOSIS — N210 Calculus in bladder: Secondary | ICD-10-CM | POA: Diagnosis not present

## 2022-11-26 LAB — CBC
HCT: 40.9 % (ref 39.0–52.0)
Hemoglobin: 13.7 g/dL (ref 13.0–17.0)
MCH: 31 pg (ref 26.0–34.0)
MCHC: 33.5 g/dL (ref 30.0–36.0)
MCV: 92.5 fL (ref 80.0–100.0)
Platelets: 248 10*3/uL (ref 150–400)
RBC: 4.42 MIL/uL (ref 4.22–5.81)
RDW: 13.2 % (ref 11.5–15.5)
WBC: 10.9 10*3/uL — ABNORMAL HIGH (ref 4.0–10.5)
nRBC: 0 % (ref 0.0–0.2)

## 2022-11-26 LAB — URINALYSIS, ROUTINE W REFLEX MICROSCOPIC
Bilirubin Urine: NEGATIVE
Glucose, UA: NEGATIVE mg/dL
Ketones, ur: 15 mg/dL — AB
Leukocytes,Ua: NEGATIVE
Nitrite: NEGATIVE
Protein, ur: 30 mg/dL — AB
Specific Gravity, Urine: 1.025 (ref 1.005–1.030)
pH: 6 (ref 5.0–8.0)

## 2022-11-26 LAB — COMPREHENSIVE METABOLIC PANEL
ALT: 14 U/L (ref 0–44)
AST: 14 U/L — ABNORMAL LOW (ref 15–41)
Albumin: 3.5 g/dL (ref 3.5–5.0)
Alkaline Phosphatase: 61 U/L (ref 38–126)
Anion gap: 10 (ref 5–15)
BUN: 18 mg/dL (ref 8–23)
CO2: 27 mmol/L (ref 22–32)
Calcium: 9 mg/dL (ref 8.9–10.3)
Chloride: 107 mmol/L (ref 98–111)
Creatinine, Ser: 1.34 mg/dL — ABNORMAL HIGH (ref 0.61–1.24)
GFR, Estimated: 52 mL/min — ABNORMAL LOW (ref >60)
Glucose, Bld: 114 mg/dL — ABNORMAL HIGH (ref 70–99)
Potassium: 3.5 mmol/L (ref 3.5–5.1)
Sodium: 144 mmol/L (ref 135–145)
Total Bilirubin: 0.6 mg/dL (ref 0.3–1.2)
Total Protein: 7 g/dL (ref 6.5–8.1)

## 2022-11-26 LAB — URINALYSIS, MICROSCOPIC (REFLEX)

## 2022-11-26 LAB — LIPASE, BLOOD: Lipase: 27 U/L (ref 11–51)

## 2022-11-26 MED ORDER — IOHEXOL 300 MG/ML  SOLN
100.0000 mL | Freq: Once | INTRAMUSCULAR | Status: AC | PRN
  Administered 2022-11-26: 100 mL via INTRAVENOUS

## 2022-11-26 NOTE — ED Notes (Signed)
Pt in trendelenburg and ice pack placed on hernia. Provider notified.

## 2022-11-26 NOTE — ED Triage Notes (Signed)
Patient reports right groin pain and lower right quad pain that started yesterday.  Hx of inguinal hernias that have not been repaired.  Reports difficulty having BM No urinary symptoms.

## 2022-11-26 NOTE — Discharge Instructions (Signed)
Please make an appointment with your urologist and general surgeon to be seen regarding recent ER visit and symptoms.  If you are unable to be seen in a timely manner I have attached a urologist and general surgeon for you to call.  Please read the attachments I have here for you your kidney stone and hernia.  If symptoms worsen please return to ER.

## 2022-11-26 NOTE — ED Notes (Signed)
Went over [employer] with patient and caregiver. Verbalized understanding. Wheeled out to lobby with family to wait on ride. IV Removed.

## 2022-11-26 NOTE — ED Provider Notes (Signed)
[facility] Provider Note   CSN: [id number] Arrival date & time: 11/26/22  1618     History  Chief Complaint  Patient presents with   Abdominal Pain    Grant Martinez is a 89 y.o. male presented with a bulge in his right groin that began yesterday.  Per patient's niece he has had small hernias in the past that his GI specialist has not repaired.  Patient states he has not had a bowel movement today.  Patient stated he does not have any difficulty peeing.  Patient had chest pain, shortness of breath, Donnell pain, nausea/vomiting, fevers, changes sensation/motor skills, overlying skin color changes, testicular pain  Home Medications Prior to Admission medications   Medication Sig Start Date End Date Taking? Authorizing Provider  benzonatate (TESSALON) 100 MG capsule Take 200 mg by mouth every 8 (eight) hours as needed. 08/10/22  Yes [provider]  Boswellia-Glucosamine-Vit D (OSTEO BI-FLEX ONE PER DAY PO) Take 1 tablet by mouth daily.   Yes [provider]  Cholecalciferol (VITAMIN D3) 125 MCG (5000 UT) CAPS Take 1 capsule by mouth daily.   Yes [provider]  colchicine 0.6 MG tablet Take 0.6 mg by mouth daily as needed (gout flare-ups).   Yes [provider]  diclofenac Sodium (VOLTAREN) 1 % GEL Apply 2 g topically 4 (four) times daily. 03/18/22  Yes [provider]  Leuprolide Acetate, 3 Month, (ELIGARD) 22.5 MG injection Inject 22.5 mg into the skin every 3 (three) months.   Yes [provider]  levothyroxine (SYNTHROID) 50 MCG tablet Take 50 mcg by mouth daily. 05/23/22  Yes [provider]  Multiple Vitamins-Minerals (CENTRUM ADULT 50+ MULTIGUMMIES) CHEW Chew by mouth.   Yes [provider]  OVER THE COUNTER MEDICATION Blue emu   Yes [provider]  pravastatin (PRAVACHOL) 20 MG tablet Take 20 mg by mouth daily.   Yes [provider]   levETIRAcetam (KEPPRA) 500 MG tablet Take 1 tablet (500 mg total) by mouth 2 (two) times daily. Patient not taking: Reported on 11/13/2022 06/21/22   Little Ishikawa, MD      Allergies    Patient has no known allergies.    Review of Systems   Review of Systems  Gastrointestinal:  Positive for abdominal pain.  See HPI  Physical Exam Updated Vital Signs BP (!) 163/88   Pulse 69   Temp 98.2 F (36.8 C) (Oral)   Resp 15   Ht '5\' 8"'$  (1.727 m)   Wt 83 kg   SpO2 97%   BMI 27.83 kg/m  Physical Exam Constitutional:      General: He is not in acute distress. HENT:     Head: Normocephalic and atraumatic.  Abdominal:     General: There is no distension.     Palpations: Abdomen is soft. There is no mass.     Tenderness: There is no abdominal tenderness. There is no right CVA tenderness, left CVA tenderness, guarding or rebound.     Hernia: A hernia is present. Hernia is present in the right inguinal area (Irreducible).  Musculoskeletal:        General: Normal range of motion.     Comments: Right inguinal hernia that was reducible  Skin:    General: Skin is warm and dry.     Capillary Refill: Capillary refill takes less than 2 seconds.     Comments: No overlying skin color changes  Neurological:  General: No focal deficit present.     Mental Status: He is alert.  Psychiatric:        Mood and Affect: Mood normal.     ED Results / Procedures / Treatments   Labs (all labs ordered are listed, but only abnormal results are displayed) Labs Reviewed  COMPREHENSIVE METABOLIC PANEL - Abnormal; Notable for the following components:      Result Value   Glucose, Bld 114 (*)    Creatinine, Ser 1.34 (*)    AST 14 (*)    GFR, Estimated 52 (*)    All other components within normal limits  CBC - Abnormal; Notable for the following components:   WBC 10.9 (*)    All other components within normal limits  URINALYSIS, ROUTINE W REFLEX MICROSCOPIC - Abnormal; Notable for the  following components:   Hgb urine dipstick TRACE (*)    Ketones, ur 15 (*)    Protein, ur 30 (*)    All other components within normal limits  URINALYSIS, MICROSCOPIC (REFLEX) - Abnormal; Notable for the following components:   Bacteria, UA RARE (*)    All other components within normal limits  LIPASE, BLOOD    EKG None  Radiology CT Abdomen Pelvis W Contrast  Result Date: 11/26/2022 CLINICAL DATA:  RIGHT groin pain RIGHT lower quadrant pain. History of RIGHT inguinal hernia. EXAM: CT ABDOMEN AND PELVIS WITH CONTRAST TECHNIQUE: Multidetector CT imaging of the abdomen and pelvis was performed using the standard protocol following bolus administration of intravenous contrast. RADIATION DOSE REDUCTION: This exam was performed according to the departmental dose-optimization program which includes automated exposure control, adjustment of the mA and/or kV according to patient size and/or use of iterative reconstruction technique. CONTRAST:  100 mL Omnipaque 300 COMPARISON:  PET-CT 10/19/2020 FINDINGS: Lower chest: Lung bases are clear. Hepatobiliary: No focal hepatic lesion. Several small gallstones. No gallbladder inflammation. No biliary duct dilatation. Common bile duct is normal. Pancreas: Pancreas is normal. No ductal dilatation. No pancreatic inflammation. Spleen: Normal spleen Adrenals/urinary tract: Small fat density lesion of the RIGHT adrenal gland measures 13 mm in consistent with a adrenal myelolipoma. 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 There is pelvicaliectasis and mild hydroureter of the RIGHT renal collecting system. This hydroureter extends to the RIGHT vesicoureteral junction. On coronal imaging, there is a punctate calcification which is favored within the distal RIGHT ureter (image 69/coronal/series 5). Additional to the obstructing calculus in the distal RIGHT ureter, there is enhancement of the uro epithelium of the RIGHT  renal pelvis (image 70/2 and image 71/2). There is fat stranding within the RIGHT renal hilum (image 72/2. The LEFT kidney contains multiple peripelvic cysts.  No obstruction. There is a calculus within the posterior dependent LEFT bladder on image 86/2. This calculus measures 5 mm. Stomach/Bowel: Stomach, small bowel, appendix, and cecum are normal. The colon and rectosigmoid colon are normal. Vascular/Lymphatic: Abdominal aorta is normal caliber with atherosclerotic calcification. There is no retroperitoneal or periportal lymphadenopathy. No pelvic lymphadenopathy. Reproductive: Prostate unremarkable. Fiducial markers within the prostate gland. Other: Large RIGHT inguinal hernia measures 5.7 cm in diameter. There is loop of nonobstructed small bowel within the RIGHT inguinal hernia sac (image 95/2) Musculoskeletal: No aggressive osseous lesion. IMPRESSION: 1. 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 There is obstructing calculus within the distal RIGHT ureter at the vesicoureteral junction. 2. Enhancement of the urothelium within the RIGHT renal pelvis and hilar inflammation  suggests urinary tract infection related to the obstruction. 3. Small bladder calculus. 4. Large RIGHT inguinal hernia contains a nonobstructed loop of small bowel. Electronically Signed   By: Suzy Bouchard M.D.   On: 11/26/2022 20:18    Procedures Procedures    Medications Ordered in ED Medications  iohexol (OMNIPAQUE) 300 MG/ML solution 100 mL (100 mLs Intravenous Contrast Given 11/26/22 1957)    ED Course/ Medical Decision Making/ A&P                             Medical Decision Making Amount and/or Complexity of Data Reviewed Labs: ordered. Radiology: ordered.  Risk Prescription drug management.   Gaynelle Adu 89 y.o. presented today for right inguinal bulge. Working DDx that I considered at this time includes, but not limited to, incarcerated hernia, strangulated hernia, mass.  R/o  DDx: Strangulated hernia, mass, SBO: These are considered less likely due to history of present illness and physical exam findings  Review of prior external notes: 06/21/2022 discharge summary  Unique Tests and My Interpretation:  CBC: Leukocytosis 10.9 Lipase: Unremarkable UA: Unremarkable CMP: Slightly decreased GFR 52 CT abdomen pelvis with contrast: Obstructing right kidney stone with UTI, right large inguinal hernia that is nonobstructing  Discussion with Independent Historian: Niece  Discussion of Management of Tests: Louis Meckel, MD Urology  Risk: Low:  - based on diagnostic testing/clinical impression and treatment plan  Risk Stratification Score: None  Staffed with Truett Mainland, MD  Plan: Patient presented for hernia. On exam patient was at any distress and had stable vitals.  On exam patient had a right inguinal hernia that was reducible however there were no overlying skin color changes or peritoneal signs noted.  Patient was unable to answer questions due to poor memory and so a CT scan was ordered to evaluate for any signs of necrosis from the hernia.  If the CT is negative patient will most likely have outpatient GI follow-up.  Patient stated he does not want any pain meds.  Patient able this time.  Urology was consulted in regards to obstructing stone with possible UTI the CT scan.  After speaking with the urologist he stated that the CT read of UTI is likely incorrect as the urine is negative for UTI and that patient could be discharged from urology standpoint.  Patient will be placed in Trendelenburg and ice placed on his hernia in 20 minutes I will try to reduce his hernia.  If I am unable to reduce his hernia General surgery will be consulted.  Hernia was able to be reduced after being iced and patient being placed in Trendelenburg for 20 minutes.  Patient will be discharged with urology and general surgeon follow-up.  Patient was given return precautions. Patient stable for  discharge at this time.  Patient verbalized understanding of plan.         Final Clinical Impression(s) / ED Diagnoses Final diagnoses:  Reducible right inguinal hernia  Nephrolithiasis    Rx / DC Orders ED Discharge Orders     None         Elvina Sidle 11/26/22 2317    Cristie Hem, MD 11/27/22 0020

## 2022-12-03 DIAGNOSIS — R350 Frequency of micturition: Secondary | ICD-10-CM | POA: Diagnosis not present

## 2022-12-03 DIAGNOSIS — C61 Malignant neoplasm of prostate: Secondary | ICD-10-CM | POA: Diagnosis not present

## 2022-12-03 DIAGNOSIS — N130 Hydronephrosis with ureteropelvic junction obstruction: Secondary | ICD-10-CM | POA: Diagnosis not present

## 2022-12-03 DIAGNOSIS — K4090 Unilateral inguinal hernia, without obstruction or gangrene, not specified as recurrent: Secondary | ICD-10-CM | POA: Diagnosis not present

## 2022-12-05 ENCOUNTER — Encounter: Payer: Self-pay | Admitting: General Surgery

## 2022-12-05 ENCOUNTER — Ambulatory Visit: Payer: Medicare Other | Admitting: General Surgery

## 2022-12-05 VITALS — BP 146/86 | HR 70 | Temp 98.1°F | Resp 14 | Ht 68.0 in | Wt 176.0 lb

## 2022-12-05 DIAGNOSIS — K4090 Unilateral inguinal hernia, without obstruction or gangrene, not specified as recurrent: Secondary | ICD-10-CM | POA: Insufficient documentation

## 2022-12-05 NOTE — Patient Instructions (Signed)
 Inguinal Hernia, Adult An inguinal hernia develops when fat or the intestines push through a weak spot in a muscle where the leg meets the lower abdomen (groin). This creates a bulge. This kind of hernia could also be: In the scrotum, if you are male. In folds of skin around the vagina, if you are male. There are three types of inguinal hernias: Hernias that can be pushed back into the abdomen (are reducible). This type rarely causes pain. Hernias that are not reducible (are incarcerated). Hernias that are not reducible and lose their blood supply (are strangulated). This type of hernia requires emergency surgery. What are the causes? This condition is caused by having a weak spot in the muscles or tissues in your groin. This develops over time. The hernia may poke through the weak spot when you suddenly strain your lower abdominal muscles, such as when you: Lift a heavy object. Strain to have a bowel movement. Constipation can lead to straining. Cough. What increases the risk? This condition is more likely to develop in: Males. Pregnant females. People who: Are overweight. Work in jobs that require long periods of standing or heavy lifting. Have had an inguinal hernia before. Smoke or have lung disease. These factors can lead to long-term (chronic) coughing. What are the signs or symptoms? Symptoms may depend on the size of the hernia. Often, a small inguinal hernia has no symptoms. Symptoms of a larger hernia may include: A bulge in the groin area. This is easier to see when standing. It might not be visible when lying down. Pain or burning in the groin. This may get worse when lifting, straining, or coughing. A dull ache or a feeling of pressure in the groin. An unusual bulge in the scrotum, in males. Symptoms of a strangulated inguinal hernia may include: A bulge in your groin that is very painful and tender to the touch. A bulge that turns red or purple. Fever, nausea, and  vomiting. Inability to have a bowel movement or to pass gas. How is this diagnosed? This condition is diagnosed based on your symptoms, your medical history, and a physical exam. Your health care provider may feel your groin area and ask you to cough. How is this treated? Treatment depends on the size of your hernia and whether you have symptoms. If you do not have symptoms, your health care provider may have you watch your hernia carefully and have you come in for follow-up visits. If your hernia is large or if you have symptoms, you may need surgery to repair the hernia. Follow these instructions at home: Lifestyle Avoid lifting heavy objects. Avoid standing for long periods of time. Do not use any products that contain nicotine or tobacco. These products include cigarettes, chewing tobacco, and vaping devices, such as e-cigarettes. If you need help quitting, ask your health care provider. Maintain a healthy weight. Preventing constipation You may need to take these actions to prevent or treat constipation: Drink enough fluid to keep your urine pale yellow. Take over-the-counter or prescription medicines. Eat foods that are high in fiber, such as beans, whole grains, and fresh fruits and vegetables. Limit foods that are high in fat and processed sugars, such as fried or sweet foods. General instructions You may try to push the hernia back in place by very gently pressing on it while lying down. Do not try to force the bulge back in if it will not push in easily. Watch your hernia for any changes in shape, size,   or color. Get help right away if you notice any changes. Take over-the-counter and prescription medicines only as told by your health care provider. Keep all follow-up visits. This is important. Contact a health care provider if: You have a fever or chills. You develop new symptoms. Your symptoms get worse. Get help right away if: You have pain in your groin that suddenly gets  worse. You have a bulge in your groin that: Suddenly gets bigger and does not get smaller. Becomes red or purple or painful to the touch. You are a man and you have a sudden pain in your scrotum, or the size of your scrotum suddenly changes. You cannot push the hernia back in place by very gently pressing on it when you are lying down. You have nausea or vomiting that does not go away. You have a fast heartbeat. You cannot have a bowel movement or pass gas. These symptoms may represent a serious problem that is an emergency. Do not wait to see if the symptoms will go away. Get medical help right away. Call your local emergency services (911 in the [place] Summary An inguinal hernia develops when fat or the intestines push through a weak spot in a muscle where your leg meets your lower abdomen (groin). This condition is caused by having a weak spot in muscles or tissues in your groin. Symptoms may depend on the size of the hernia, and they may include pain or swelling in your groin. A small inguinal hernia often has no symptoms. Treatment may not be needed if you do not have symptoms. If you have symptoms or a large hernia, you may need surgery to repair the hernia. Avoid lifting heavy objects. Also, avoid standing for long periods of time. This information is not intended to replace advice given to you by your health care provider. Make sure you discuss any questions you have with your health care provider. Document Revised: 05/02/2020 Document Reviewed: 05/02/2020 Elsevier Patient Education  2023 [employer] Open Hernia Repair, Adult Open hernia repair is a surgical procedure to fix a hernia. A hernia occurs when an internal organ or tissue pushes through a weak spot in the muscles along the wall of the abdomen. Hernias commonly occur in the groin and around the belly button. Most hernias tend to get worse over time. Often, surgery is done to prevent the hernia from becoming bigger,  uncomfortable, or an emergency. Emergency surgery may be needed if contents of the abdomen get stuck in the opening (incarcerated hernia) or if the blood supply gets cut off (strangulated hernia). In an open repair, an incision is made in the abdomen to perform the surgery. Tell a health care provider about: Any allergies you have. All medicines you are taking, including vitamins, herbs, eye drops, creams, and over-the-counter medicines. Any problems you or family members have had with anesthetic medicines. Any blood or bone disorders you have. Any surgeries you have had. Any medical conditions you have, including any recent cold or flu (influenza)symptoms. Whether you are pregnant or may be pregnant. What are the risks? Generally, this is a safe procedure. However, problems may occur, including: Long-lasting (chronic) pain. Bleeding. Infection. Damage to the testicles. This can cause shrinking or swelling. Damage to nearby structures or organs, including the bladder, blood vessels, intestines, or nerves near the hernia. Blood clots. Trouble passing urine. Return of the hernia. What happens before the procedure? Medicines Ask your health care provider about: Changing or stopping your regular medicines. This is   especially important if you are taking diabetes medicines or blood thinners. Taking medicines such as aspirin and ibuprofen. These medicines can thin your blood. Do not take these medicines unless your health care provider tells you to take them. Taking over-the-counter medicines, vitamins, herbs, and supplements. Surgery safety Ask your health care provider: How your surgery site will be marked. What steps will be taken to help prevent infection. These steps may include: Removing hair at the surgery site. Washing skin with a germ-killing soap. Receiving antibiotic medicine. General instructions You may have an exam or testing, such as blood tests or imaging studies. Do not  use any products that contain nicotine or tobacco for at least 4 weeks before the procedure. These products include cigarettes, chewing tobacco, and vaping devices, such as e-cigarettes. If you need help quitting, ask your health care provider. Let your health care provider know if you develop a cold or any infection before your surgery. If you get an infection before surgery, you may receive antibiotics to treat it. Plan to have a responsible adult take you home from the hospital or clinic. If you will be going home right after the procedure, plan to have a responsible adult care for you for the time you are told. This is important. What happens during the procedure?  An IV will be inserted into one of your veins. You will be given one or more of the following: A medicine to help you relax (sedative). A medicine to numb the area (local anesthetic). A medicine to make you fall asleep (general anesthetic). Your surgeon will make an incision over the hernia. The tissues of the hernia will be moved back into place. The edges of the hernia may be stitched (sutured) together. The opening in the abdominal muscles will be closed with stitches (sutures). Or, your surgeon will place a mesh patch made of artificial (synthetic) material over the opening. The incision will be closed with sutures, skin glue, or adhesive strips. A bandage (dressing) may be placed over the incision. The procedure may vary among health care providers and hospitals. What happens after the procedure? Your blood pressure, heart rate, breathing rate, and blood oxygen level will be monitored until you leave the hospital or clinic. You may be given medicine for pain. If you were given a sedative during the procedure, it can affect you for several hours. Do not drive or operate machinery until your health care provider says that it is safe. Summary Open hernia repair is a surgical procedure to fix a hernia. Hernias commonly occur in  the groin and around the belly button. Emergency surgery may be needed if contents of the abdomen get stuck in the opening (incarcerated hernia) or if the blood supply gets cut off (strangulated hernia). In this procedure, an incision is made in the abdomen to perform the surgery. After the procedure, you may be given medicine for pain. This information is not intended to replace advice given to you by your health care provider. Make sure you discuss any questions you have with your health care provider. Document Revised: 04/17/2020 Document Reviewed: 04/17/2020 Elsevier Patient Education  2023 [employer] Inc.  

## 2022-12-05 NOTE — Progress Notes (Signed)
[facility] History and Physical  Reason for Referral: Right inguinal hernia  Referring Physician: ED   Chief Complaint   New Patient (Initial Visit)     Grant Martinez is a 89 y.o. male.  HPI: Grant Martinez is a very sweet 89 yo who still lives alone but his niece Grant Martinez helps him with most of his activities of daily living. He has known he had hernias for several years (over 4) and that the one on the right has been getting larger. He started having pain recently and was taken to the ED. He was found to have an incarcerated right inguinal hernia with small bowel that was ultimately reduced in the ED. CT showed a fat containing left inguinal hernia but he has no symptoms on that side.   He denies any obstructive symptoms and has not had any further symptoms since the incarceration.   He is here to discuss repair given the incarceration and concern and pain they had with this event.   Past Medical History:  Diagnosis Date   Gout    Hyperlipemia    Hypertension    Prostate cancer ([place])    Thyroid disease     History reviewed. No pertinent surgical history.  History reviewed. No pertinent family history.  Social History   Tobacco Use   Smoking status: Never   Smokeless tobacco: Never  Substance Use Topics   Alcohol use: No   Drug use: Never    Medications: I have reviewed the patient's current medications. Allergies as of 12/05/2022   No Known Allergies      Medication List        Accurate as of December 05, 2022 11:59 PM. If you have any questions, ask your nurse or doctor.          STOP taking these medications    benzonatate 100 MG capsule Commonly known as: TESSALON Stopped by: Virl Cagey, MD   levETIRAcetam 500 MG tablet Commonly known as: KEPPRA Stopped by: Virl Cagey, MD       TAKE these medications    Centrum Adult 50+ MultiGummies Chew Chew by mouth.   colchicine 0.6 MG tablet Take 0.6 mg by mouth daily as  needed (gout flare-ups).   diclofenac Sodium 1 % Gel Commonly known as: VOLTAREN Apply 2 g topically 4 (four) times daily.   Leuprolide Acetate (3 Month) 22.5 MG injection Commonly known as: ELIGARD Inject 22.5 mg into the skin every 3 (three) months.   levothyroxine 50 MCG tablet Commonly known as: SYNTHROID Take 50 mcg by mouth daily.   OSTEO BI-FLEX ONE PER DAY PO Take 1 tablet by mouth daily.   OVER THE COUNTER MEDICATION Blue emu   pravastatin 20 MG tablet Commonly known as: PRAVACHOL Take 20 mg by mouth daily.   Vitamin D3 125 MCG (5000 UT) capsule Take 1 capsule by mouth daily.         ROS:  A comprehensive review of systems was negative except for: Respiratory: positive for cough and sob at times Gastrointestinal: positive for abdominal pain, reflux symptoms, and hernia on right groin Genitourinary: positive for frequency and retention Musculoskeletal: positive for back pain, neck pain, and joint pain  Blood pressure (!) 146/86, pulse 70, temperature 98.1 F (36.7 C), temperature source Oral, resp. rate 14, height 5\' 8"  (1.727 m), weight 176 lb (79.8 kg), SpO2 94 %. Physical Exam Vitals reviewed.  HENT:     Head: Normocephalic and atraumatic.  Nose: Nose normal.     Mouth/Throat:     Mouth: Mucous membranes are moist.  Eyes:     Extraocular Movements: Extraocular movements intact.  Cardiovascular:     Rate and Rhythm: Normal rate and regular rhythm.  Pulmonary:     Effort: Pulmonary effort is normal.     Breath sounds: Normal breath sounds.  Abdominal:     General: There is no distension.     Palpations: Abdomen is soft.     Tenderness: There is abdominal tenderness.     Hernia: A hernia is present. Hernia is present in the left inguinal area and right inguinal area.     Comments: Small left inguinal hernia, reducible, nontender, right inguinal hernia, reducible, tender  Musculoskeletal:        General: Normal range of motion.  Skin:     General: Skin is warm.  Neurological:     General: No focal deficit present.     Mental Status: He is alert and oriented to person, place, and time.  Psychiatric:        Mood and Affect: Mood normal.        Behavior: Behavior normal.        Thought Content: Thought content normal.     Results: CLINICAL DATA:  RIGHT groin pain RIGHT lower quadrant pain. History of RIGHT inguinal hernia.   EXAM: CT ABDOMEN AND PELVIS WITH CONTRAST   TECHNIQUE: Multidetector CT imaging of the abdomen and pelvis was performed using the standard protocol following bolus administration of intravenous contrast.   RADIATION DOSE REDUCTION: This exam was performed according to the departmental dose-optimization program which includes automated exposure control, adjustment of the mA and/or kV according to patient size and/or use of iterative reconstruction technique.   CONTRAST:  100 mL Omnipaque 300   COMPARISON:  PET-CT 10/19/2020   FINDINGS: Lower chest: Lung bases are clear.   Hepatobiliary: No focal hepatic lesion. Several small gallstones. No gallbladder inflammation. No biliary duct dilatation. Common bile duct is normal.   Pancreas: Pancreas is normal. No ductal dilatation. No pancreatic inflammation.   Spleen: Normal spleen   Adrenals/urinary tract: Small fat density lesion of the RIGHT adrenal gland measures 13 mm in consistent with a adrenal myelolipoma. 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 There is pelvicaliectasis and mild hydroureter of the RIGHT renal collecting system. This hydroureter extends to the RIGHT vesicoureteral junction. On coronal imaging, there is a punctate calcification which is favored within the distal RIGHT ureter (image 69/coronal/series 5).   Additional to the obstructing calculus in the distal RIGHT ureter, there is enhancement of the uro epithelium of the RIGHT renal pelvis (image 70/2  and image 71/2). There is fat stranding within the RIGHT renal hilum (image 72/2.   The LEFT kidney contains multiple peripelvic cysts.  No obstruction.   There is a calculus within the posterior dependent LEFT bladder on image 86/2. This calculus measures 5 mm.   Stomach/Bowel: Stomach, small bowel, appendix, and cecum are normal. The colon and rectosigmoid colon are normal.   Vascular/Lymphatic: Abdominal aorta is normal caliber with atherosclerotic calcification. There is no retroperitoneal or periportal lymphadenopathy. No pelvic lymphadenopathy.   Reproductive: Prostate unremarkable. Fiducial markers within the prostate gland.   Other: Large RIGHT inguinal hernia measures 5.7 cm in diameter. There is loop of nonobstructed small bowel within the RIGHT inguinal hernia sac (image 95/2)   Musculoskeletal: No aggressive osseous lesion.  IMPRESSION: 1. 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 There is obstructing calculus within the distal RIGHT ureter at the vesicoureteral junction. 2. Enhancement of the urothelium within the RIGHT renal pelvis and hilar inflammation suggests urinary tract infection related to the obstruction. 3. Small bladder calculus. 4. Large RIGHT inguinal hernia contains a nonobstructed loop of small bowel.     Electronically Signed   By: Suzy Bouchard M.D.   On: 11/26/2022 20:18     Assessment & Plan:  Grant Martinez is a 89 y.o. male with bilateral inguinal hernia but right that is symptomatic. Discussed the options of laparoscopic robotic assisted versus open with him and his niece.   Discussed the risk and benefits including, bleeding, infection, use of mesh, risk of recurrence, risk of nerve damage causing numbness or changes in sensation, risk of damage to the cord structures. The patient understands the risk and benefits of repair with mesh, and has decided to proceed.  We also discussed open versus robotic  assisted laparoscopic surgery and the use of mesh. We discussed that I do both robotic and open repairs with mesh, and that these are considered equivalent. We discussed reasons for opting for laparoscopic surgery including if a bilateral repair is needed or if a patient has a recurrence after an open repair. We discussed the option of watch and wait in men and discussed that in 5 years some studies report that 40% of men have crossed over to needing a hernia repair because the hernia has become larger or symptomatic. We discussed that women are not appropriate candidate for watchful waiting due to the risk of femoral hernias.   Plan for open hernia repair on the right only given his age and other health issues to hopefully cut down on time for anesthesia and risk.    All questions were answered to the satisfaction of the patient and family.     Virl Cagey 12/06/2022, 1:15 PM

## 2022-12-06 ENCOUNTER — Encounter: Payer: Self-pay | Admitting: General Surgery

## 2022-12-06 NOTE — H&P (Signed)
[facility] History and Physical  Reason for Referral: Right inguinal hernia  Referring Physician: ED   Chief Complaint   New Patient (Initial Visit)     Grant Martinez is a 89 y.o. male.  HPI: Grant Martinez is a very sweet 89 yo who still lives alone but his niece Grant Martinez helps him with most of his activities of daily living. He has known he had hernias for several years (over 4) and that the one on the right has been getting larger. He started having pain recently and was taken to the ED. He was found to have an incarcerated right inguinal hernia with small bowel that was ultimately reduced in the ED. CT showed a fat containing left inguinal hernia but he has no symptoms on that side.   He denies any obstructive symptoms and has not had any further symptoms since the incarceration.   He is here to discuss repair given the incarceration and concern and pain they had with this event.   Past Medical History:  Diagnosis Date   Gout    Hyperlipemia    Hypertension    Prostate cancer ([place])    Thyroid disease     History reviewed. No pertinent surgical history.  History reviewed. No pertinent family history.  Social History   Tobacco Use   Smoking status: Never   Smokeless tobacco: Never  Substance Use Topics   Alcohol use: No   Drug use: Never    Medications: I have reviewed the patient's current medications. Allergies as of 12/05/2022   No Known Allergies      Medication List        Accurate as of December 05, 2022 11:59 PM. If you have any questions, ask your nurse or doctor.          STOP taking these medications    benzonatate 100 MG capsule Commonly known as: TESSALON Stopped by: Virl Cagey, MD   levETIRAcetam 500 MG tablet Commonly known as: KEPPRA Stopped by: Virl Cagey, MD       TAKE these medications    Centrum Adult 50+ MultiGummies Chew Chew by mouth.   colchicine 0.6 MG tablet Take 0.6 mg by mouth daily as  needed (gout flare-ups).   diclofenac Sodium 1 % Gel Commonly known as: VOLTAREN Apply 2 g topically 4 (four) times daily.   Leuprolide Acetate (3 Month) 22.5 MG injection Commonly known as: ELIGARD Inject 22.5 mg into the skin every 3 (three) months.   levothyroxine 50 MCG tablet Commonly known as: SYNTHROID Take 50 mcg by mouth daily.   OSTEO BI-FLEX ONE PER DAY PO Take 1 tablet by mouth daily.   OVER THE COUNTER MEDICATION Blue emu   pravastatin 20 MG tablet Commonly known as: PRAVACHOL Take 20 mg by mouth daily.   Vitamin D3 125 MCG (5000 UT) capsule Take 1 capsule by mouth daily.         ROS:  A comprehensive review of systems was negative except for: Respiratory: positive for cough and sob at times Gastrointestinal: positive for abdominal pain, reflux symptoms, and hernia on right groin Genitourinary: positive for frequency and retention Musculoskeletal: positive for back pain, neck pain, and joint pain  Blood pressure (!) 146/86, pulse 70, temperature 98.1 F (36.7 C), temperature source Oral, resp. rate 14, height 5\' 8"  (1.727 m), weight 176 lb (79.8 kg), SpO2 94 %. Physical Exam Vitals reviewed.  HENT:     Head: Normocephalic and atraumatic.  Nose: Nose normal.     Mouth/Throat:     Mouth: Mucous membranes are moist.  Eyes:     Extraocular Movements: Extraocular movements intact.  Cardiovascular:     Rate and Rhythm: Normal rate and regular rhythm.  Pulmonary:     Effort: Pulmonary effort is normal.     Breath sounds: Normal breath sounds.  Abdominal:     General: There is no distension.     Palpations: Abdomen is soft.     Tenderness: There is abdominal tenderness.     Hernia: A hernia is present. Hernia is present in the left inguinal area and right inguinal area.     Comments: Small left inguinal hernia, reducible, nontender, right inguinal hernia, reducible, tender  Musculoskeletal:        General: Normal range of motion.  Skin:     General: Skin is warm.  Neurological:     General: No focal deficit present.     Mental Status: He is alert and oriented to person, place, and time.  Psychiatric:        Mood and Affect: Mood normal.        Behavior: Behavior normal.        Thought Content: Thought content normal.     Results: CLINICAL DATA:  RIGHT groin pain RIGHT lower quadrant pain. History of RIGHT inguinal hernia.   EXAM: CT ABDOMEN AND PELVIS WITH CONTRAST   TECHNIQUE: Multidetector CT imaging of the abdomen and pelvis was performed using the standard protocol following bolus administration of intravenous contrast.   RADIATION DOSE REDUCTION: This exam was performed according to the departmental dose-optimization program which includes automated exposure control, adjustment of the mA and/or kV according to patient size and/or use of iterative reconstruction technique.   CONTRAST:  100 mL Omnipaque 300   COMPARISON:  PET-CT 10/19/2020   FINDINGS: Lower chest: Lung bases are clear.   Hepatobiliary: No focal hepatic lesion. Several small gallstones. No gallbladder inflammation. No biliary duct dilatation. Common bile duct is normal.   Pancreas: Pancreas is normal. No ductal dilatation. No pancreatic inflammation.   Spleen: Normal spleen   Adrenals/urinary tract: Small fat density lesion of the RIGHT adrenal gland measures 13 mm in consistent with a adrenal myelolipoma. 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 There is pelvicaliectasis and mild hydroureter of the RIGHT renal collecting system. This hydroureter extends to the RIGHT vesicoureteral junction. On coronal imaging, there is a punctate calcification which is favored within the distal RIGHT ureter (image 69/coronal/series 5).   Additional to the obstructing calculus in the distal RIGHT ureter, there is enhancement of the uro epithelium of the RIGHT renal pelvis (image 70/2  and image 71/2). There is fat stranding within the RIGHT renal hilum (image 72/2.   The LEFT kidney contains multiple peripelvic cysts.  No obstruction.   There is a calculus within the posterior dependent LEFT bladder on image 86/2. This calculus measures 5 mm.   Stomach/Bowel: Stomach, small bowel, appendix, and cecum are normal. The colon and rectosigmoid colon are normal.   Vascular/Lymphatic: Abdominal aorta is normal caliber with atherosclerotic calcification. There is no retroperitoneal or periportal lymphadenopathy. No pelvic lymphadenopathy.   Reproductive: Prostate unremarkable. Fiducial markers within the prostate gland.   Other: Large RIGHT inguinal hernia measures 5.7 cm in diameter. There is loop of nonobstructed small bowel within the RIGHT inguinal hernia sac (image 95/2)   Musculoskeletal: No aggressive osseous lesion.  IMPRESSION: 1. 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 There is obstructing calculus within the distal RIGHT ureter at the vesicoureteral junction. 2. Enhancement of the urothelium within the RIGHT renal pelvis and hilar inflammation suggests urinary tract infection related to the obstruction. 3. Small bladder calculus. 4. Large RIGHT inguinal hernia contains a nonobstructed loop of small bowel.     Electronically Signed   By: Suzy Bouchard M.D.   On: 11/26/2022 20:18     Assessment & Plan:  Grant Martinez is a 89 y.o. male with bilateral inguinal hernia but right that is symptomatic. Discussed the options of laparoscopic robotic assisted versus open with him and his niece.   Discussed the risk and benefits including, bleeding, infection, use of mesh, risk of recurrence, risk of nerve damage causing numbness or changes in sensation, risk of damage to the cord structures. The patient understands the risk and benefits of repair with mesh, and has decided to proceed.  We also discussed open versus robotic  assisted laparoscopic surgery and the use of mesh. We discussed that I do both robotic and open repairs with mesh, and that these are considered equivalent. We discussed reasons for opting for laparoscopic surgery including if a bilateral repair is needed or if a patient has a recurrence after an open repair. We discussed the option of watch and wait in men and discussed that in 5 years some studies report that 40% of men have crossed over to needing a hernia repair because the hernia has become larger or symptomatic. We discussed that women are not appropriate candidate for watchful waiting due to the risk of femoral hernias.   Plan for open hernia repair on the right only given his age and other health issues to hopefully cut down on time for anesthesia and risk.    All questions were answered to the satisfaction of the patient and family.     Virl Cagey 12/06/2022, 1:15 PM

## 2022-12-09 ENCOUNTER — Encounter (HOSPITAL_COMMUNITY)
Admission: RE | Admit: 2022-12-09 | Discharge: 2022-12-09 | Disposition: A | Discharge status: Home / Self Care | Payer: Medicare Other | Source: Ambulatory Visit | Attending: Internal Medicine | Admitting: Internal Medicine

## 2022-12-09 ENCOUNTER — Other Ambulatory Visit: Payer: Self-pay

## 2022-12-09 ENCOUNTER — Encounter (HOSPITAL_COMMUNITY): Payer: Self-pay

## 2022-12-11 ENCOUNTER — Encounter (HOSPITAL_COMMUNITY): Payer: Self-pay | Admitting: Internal Medicine

## 2022-12-11 ENCOUNTER — Ambulatory Visit (HOSPITAL_BASED_OUTPATIENT_CLINIC_OR_DEPARTMENT_OTHER): Payer: Medicare Other | Admitting: Anesthesiology

## 2022-12-11 ENCOUNTER — Ambulatory Visit (HOSPITAL_COMMUNITY): Payer: Medicare Other | Admitting: Anesthesiology

## 2022-12-11 ENCOUNTER — Encounter (HOSPITAL_COMMUNITY)
Admission: RE | Disposition: A | Discharge status: Home / Self Care | Payer: Self-pay | Source: Home / Self Care | Attending: Internal Medicine

## 2022-12-11 ENCOUNTER — Telehealth: Payer: Self-pay

## 2022-12-11 ENCOUNTER — Ambulatory Visit (HOSPITAL_COMMUNITY)
Admission: RE | Admit: 2022-12-11 | Discharge: 2022-12-11 | Disposition: A | Discharge status: Home / Self Care | Payer: Medicare Other | Attending: Internal Medicine | Admitting: Internal Medicine

## 2022-12-11 DIAGNOSIS — Z79899 Other long term (current) drug therapy: Secondary | ICD-10-CM | POA: Diagnosis not present

## 2022-12-11 DIAGNOSIS — E039 Hypothyroidism, unspecified: Secondary | ICD-10-CM | POA: Diagnosis not present

## 2022-12-11 DIAGNOSIS — B9681 Helicobacter pylori [H. pylori] as the cause of diseases classified elsewhere: Secondary | ICD-10-CM | POA: Diagnosis not present

## 2022-12-11 DIAGNOSIS — K2950 Unspecified chronic gastritis without bleeding: Secondary | ICD-10-CM | POA: Insufficient documentation

## 2022-12-11 DIAGNOSIS — G4730 Sleep apnea, unspecified: Secondary | ICD-10-CM | POA: Insufficient documentation

## 2022-12-11 DIAGNOSIS — K2970 Gastritis, unspecified, without bleeding: Secondary | ICD-10-CM | POA: Diagnosis not present

## 2022-12-11 DIAGNOSIS — M109 Gout, unspecified: Secondary | ICD-10-CM | POA: Diagnosis not present

## 2022-12-11 DIAGNOSIS — I10 Essential (primary) hypertension: Secondary | ICD-10-CM | POA: Insufficient documentation

## 2022-12-11 DIAGNOSIS — R1310 Dysphagia, unspecified: Secondary | ICD-10-CM

## 2022-12-11 DIAGNOSIS — K222 Esophageal obstruction: Secondary | ICD-10-CM | POA: Diagnosis not present

## 2022-12-11 DIAGNOSIS — E785 Hyperlipidemia, unspecified: Secondary | ICD-10-CM | POA: Diagnosis not present

## 2022-12-11 DIAGNOSIS — Z8546 Personal history of malignant neoplasm of prostate: Secondary | ICD-10-CM | POA: Insufficient documentation

## 2022-12-11 HISTORY — PX: BIOPSY: SHX5522

## 2022-12-11 HISTORY — PX: ESOPHAGOGASTRODUODENOSCOPY (EGD) WITH PROPOFOL: SHX5813

## 2022-12-11 HISTORY — PX: MALONEY DILATION: SHX5535

## 2022-12-11 SURGERY — ESOPHAGOGASTRODUODENOSCOPY (EGD) WITH PROPOFOL
Anesthesia: General

## 2022-12-11 MED ORDER — LIDOCAINE VISCOUS HCL 2 % MT SOLN
OROMUCOSAL | Status: AC
  Administered 2022-12-11: 15 mL via OROMUCOSAL
  Filled 2022-12-11: qty 15

## 2022-12-11 MED ORDER — LIDOCAINE VISCOUS HCL 2 % MT SOLN: 15.0000 mL | Freq: Once | OROMUCOSAL | Status: AC

## 2022-12-11 MED ORDER — PROPOFOL 10 MG/ML IV BOLUS
INTRAVENOUS | Status: DC | PRN
  Administered 2022-12-11: 50 mg via INTRAVENOUS
  Administered 2022-12-11: 30 mg via INTRAVENOUS
  Administered 2022-12-11: 70 mg via INTRAVENOUS
  Administered 2022-12-11 (×2): 20 mg via INTRAVENOUS

## 2022-12-11 MED ORDER — PANTOPRAZOLE SODIUM 40 MG PO TBEC: 40.0000 mg | DELAYED_RELEASE_TABLET | Freq: Every day | ORAL | 3 refills | Status: DC

## 2022-12-11 MED ORDER — LIDOCAINE HCL 1 % IJ SOLN
INTRAMUSCULAR | Status: DC | PRN
  Administered 2022-12-11: 50 mg via INTRADERMAL

## 2022-12-11 MED ORDER — LACTATED RINGERS IV SOLN: INTRAVENOUS | Status: DC

## 2022-12-11 NOTE — Telephone Encounter (Signed)
-----   Message from Daneil Dolin, MD sent at 12/11/2022  1:27 PM EDT -----  patient needs a new prescription for Protonix 40 mg pill dispense 30 with 11 refills  Take (1)30 minutes before breakfast daily

## 2022-12-11 NOTE — Discharge Instructions (Signed)
EGD Discharge instructions Please read the instructions outlined below and refer to this sheet in the next few weeks. These discharge instructions provide you with general information on caring for yourself after you leave the hospital. Your doctor may also give you specific instructions. While your treatment has been planned according to the most current medical practices available, unavoidable complications occasionally occur. If you have any problems or questions after discharge, please call your doctor. ACTIVITY You may resume your regular activity but move at a slower pace for the next 24 hours.  Take frequent rest periods for the next 24 hours.  Walking will help expel (get rid of) the air and reduce the bloated feeling in your abdomen.  No driving for 24 hours (because of the anesthesia (medicine) used during the test).  You may shower.  Do not sign any important legal documents or operate any machinery for 24 hours (because of the anesthesia used during the test).  NUTRITION Drink plenty of fluids.  You may resume your normal diet.  Begin with a light meal and progress to your normal diet.  Avoid alcoholic beverages for 24 hours or as instructed by your caregiver.  MEDICATIONS You may resume your normal medications unless your caregiver tells you otherwise.  WHAT YOU CAN EXPECT TODAY You may experience abdominal discomfort such as a feeling of fullness or "gas" pains.  FOLLOW-UP Your doctor will discuss the results of your test with you.  SEEK IMMEDIATE MEDICAL ATTENTION IF ANY OF THE FOLLOWING OCCUR: Excessive nausea (feeling sick to your stomach) and/or vomiting.  Severe abdominal pain and distention (swelling).  Trouble swallowing.  Temperature over 101 F (37.8 C).  Rectal bleeding or vomiting of blood.       You had a stricture in your esophagus.  Your esophagus was dilated.  You should swallow better     need to keep the acid out of your esophagus.  Begin Protonix 40 mg  pill take 1 pill 30 minutes before breakfast daily.  A new prescription will be sent directly to your pharmacy from my office   stomach appeared mildly inflamed.  Biopsies were taken.  Further recommendations to follow pending review of pathology report  Office visit with Aliene Altes in 3 months  At patient request I called Donney Rankins, [phone] -  reviewed findings and recommendations

## 2022-12-11 NOTE — Anesthesia Preprocedure Evaluation (Signed)
Anesthesia Evaluation  Patient identified by MRN, date of birth, ID band Patient awake    Reviewed: Allergy & Precautions, H&P , NPO status , Patient's Chart, lab work & pertinent test results  Airway Mallampati: III  TM Distance: >3 FB Neck ROM: Full    Dental  (+) Dental Advisory Given   Pulmonary sleep apnea (snoring)    Pulmonary exam normal breath sounds clear to auscultation       Cardiovascular Exercise Tolerance: Poor hypertension, Pt. on medications Normal cardiovascular exam Rhythm:Regular Rate:Normal     Neuro/Psych  Neuromuscular disease  negative psych ROS   GI/Hepatic negative GI ROS, Neg liver ROS,,,  Endo/Other  Hypothyroidism    Renal/GU negative Renal ROS  negative genitourinary   Musculoskeletal negative musculoskeletal ROS (+)    Abdominal   Peds negative pediatric ROS (+)  Hematology negative hematology ROS (+)   Anesthesia Other Findings   Reproductive/Obstetrics negative OB ROS                             Anesthesia Physical Anesthesia Plan  ASA: 3  Anesthesia Plan: General   Post-op Pain Management: Minimal or no pain anticipated   Induction: Intravenous  PONV Risk Score and Plan: Propofol infusion and Treatment may vary due to age or medical condition  Airway Management Planned: Nasal Cannula and Natural Airway  Additional Equipment:   Intra-op Plan:   Post-operative Plan:   Informed Consent: I have reviewed the patients History and Physical, chart, labs and discussed the procedure including the risks, benefits and alternatives for the proposed anesthesia with the patient or authorized representative who has indicated his/her understanding and acceptance.     Dental advisory given  Plan Discussed with: CRNA and Surgeon  Anesthesia Plan Comments:        Anesthesia Quick Evaluation

## 2022-12-11 NOTE — Telephone Encounter (Signed)
Rx sent to pharmacy on file.

## 2022-12-11 NOTE — Op Note (Signed)
[facility] Patient Name: Grant Martinez Procedure Date: 12/11/2022 11:52 AM MRN: [id number] Date of Birth: 01/03/1934 Attending MD: Norvel Richards , MD, [id number] CSN: [id number] Age: 89 Admit Type: Outpatient Procedure:                Upper GI endoscopy Indications:              Dysphagia Providers:                Norvel Richards, MD, Crystal Page, Raphael Gibney, Technician Referring MD:              Medicines:                Propofol per Anesthesia Complications:            No immediate complications. Estimated Blood Loss:     Estimated blood loss was minimal. Procedure:                Pre-Anesthesia Assessment:                           - Prior to the procedure, a History and Physical                            was performed, and patient medications and                            allergies were reviewed. The patient's tolerance of                            previous anesthesia was also reviewed. The risks                            and benefits of the procedure and the sedation                            options and risks were discussed with the patient.                            All questions were answered, and informed consent                            was obtained. Prior Anticoagulants: The patient has                            taken no anticoagulant or antiplatelet agents. ASA                            Grade Assessment: III - A patient with severe                            systemic disease. After reviewing the risks and  benefits, the patient was deemed in satisfactory                            condition to undergo the procedure.                           After obtaining informed consent, the endoscope was                            passed under direct vision. Throughout the                            procedure, the patient's blood pressure, pulse, and                            oxygen saturations were  monitored continuously. The                            [id number]-[id number] scope was introduced through the                            mouth, and advanced to the second part of duodenum.                            The upper GI endoscopy was accomplished without                            difficulty. The patient tolerated the procedure                            well. Scope In: 1:10:59 PM Scope Out: 1:18:41 PM Total Procedure Duration: 0 hours 7 minutes 42 seconds  Findings:      One benign-appearing, intrinsic moderate stenosis / peptic stricture at       GE junction. No tumor seen gastric cavity empty. Diffuse mottling of the       gastric mucosa; no ulcer or infiltrating process seen.      Pylorus patent.      The duodenal bulb and second portion of the duodenum were normal. The       scope was withdrawn. Dilation was performed with a Maloney dilator with       mild resistance at 31 Fr. The dilation site was examined following       endoscope reinsertion and showed moderate mucosal disruption. Estimated       blood loss was minimal. Finally, biopsies of the gastric antrum and body       were taken for histologic study. Impression:               - Benign-appearing esophageal stenosis. Dilated.                            Inflamed appearing gastric mucosa of uncertain                            significance?"status post biopsy                           -  Normal duodenal bulb and second portion of the                            duodenum. Moderate Sedation:      Moderate (conscious) sedation was personally administered by an       anesthesia professional. The following parameters were monitored: oxygen       saturation, heart rate, blood pressure, respiratory rate, EKG, adequacy       of pulmonary ventilation, and response to care. Recommendation:           - Patient has a contact number available for                            emergencies. The signs and symptoms of potential                             delayed complications were discussed with the                            patient. Return to normal activities tomorrow.                            Written discharge instructions were provided to the                            patient.                           - Advance diet as tolerated. Begin Protonix 40 mg                            once daily 30 minutes for breakfast. New                            prescription provided through the office to his                            pharmacy. Follow-up on pathology. Office visit with                            [place] in 3 months Procedure Code(s):        --- Professional ---                           [phone], Esophagogastroduodenoscopy, flexible,                            transoral; diagnostic, including collection of                            specimen(s) by brushing or washing, when performed                            (separate procedure)  43450, Dilation of esophagus, by unguided sound or                            bougie, single or multiple passes Diagnosis Code(s):        --- Professional ---                           K22.2, Esophageal obstruction                           R13.10, Dysphagia, unspecified CPT copyright 2022 American Medical Association. All rights reserved. The codes documented in this report are preliminary and upon coder review may  be revised to meet current compliance requirements. Cristopher Estimable. Jamaiyah Pyle, MD Norvel Richards, MD 12/11/2022 1:34:07 PM This report has been signed electronically. Number of Addenda: 0

## 2022-12-11 NOTE — Interval H&P Note (Signed)
History and Physical Interval Note:  12/11/2022 12:53 PM  Grant Martinez  has presented today for surgery, with the diagnosis of dysphagia.  The various methods of treatment have been discussed with the patient and family. After consideration of risks, benefits and other options for treatment, the patient has consented to  Procedure(s) with comments: ESOPHAGOGASTRODUODENOSCOPY (EGD) WITH PROPOFOL (N/A) - 12:15 pm, asa 3 MALONEY DILATION (N/A) as a surgical intervention.  The patient's history has been reviewed, patient examined, no change in status, stable for surgery.  I have reviewed the patient's chart and labs.  Questions were answered to the patient's satisfaction.     Manus Rudd    Patient seen and examined in short stay.  Discussed with granddaughter Arlena in attendance.   Agree with need for EGD with possible esophageal dilation as feasible/appropriate  per plan. The risks, benefits, limitations, alternatives and imponderables have been reviewed with the patient. Potential for esophageal dilation, biopsy, etc. have also been reviewed.  Questions have been answered. All parties agreeable.

## 2022-12-12 LAB — SURGICAL PATHOLOGY

## 2022-12-12 NOTE — Anesthesia Postprocedure Evaluation (Signed)
Anesthesia Post Note  Patient: Grant Martinez  Procedure(s) Performed: ESOPHAGOGASTRODUODENOSCOPY (EGD) WITH PROPOFOL [place]  Patient location during evaluation: Phase II Anesthesia Type: General Level of consciousness: awake and alert and oriented Pain management: pain level controlled Vital Signs Assessment: post-procedure vital signs reviewed and stable Respiratory status: spontaneous breathing, nonlabored ventilation and respiratory function stable Cardiovascular status: blood pressure returned to baseline and stable Postop Assessment: no apparent nausea or vomiting Anesthetic complications: no  No notable events documented.   Last Vitals:  Vitals:   12/11/22 1035 12/11/22 1324  BP: (!) 143/83 110/69  Pulse: 71 74  Resp: 16 (!) 22  Temp: 36.6 C 36.4 C  SpO2: 95% 95%    Last Pain:  Vitals:   12/12/22 1432  TempSrc:   PainSc: 0-No pain                 Analie Katzman C Leylanie Woodmansee

## 2022-12-12 NOTE — Transfer of Care (Signed)
Immediate Anesthesia Transfer of Care Note  Patient: Grant Martinez  Procedure(s) Performed: ESOPHAGOGASTRODUODENOSCOPY (EGD) WITH PROPOFOL [place]  Patient Location: PACU  Anesthesia Type:General  Level of Consciousness: awake, alert , sedated, and drowsy  Airway & Oxygen Therapy: Patient Spontanous Breathing  Post-op Assessment: Report given to RN and Post -op Vital signs reviewed and stable  Post vital signs: Reviewed and stable  Last Vitals:  Vitals Value Taken Time  BP 110/69 12/11/22 1324  Temp 36.4 C 12/11/22 1324  Pulse 74 12/11/22 1324  Resp 22 12/11/22 1324  SpO2 95 % 12/11/22 1324    Last Pain:  Vitals:   12/12/22 1432  TempSrc:   PainSc: 0-No pain         Complications: No notable events documented.

## 2022-12-16 ENCOUNTER — Telehealth: Payer: Self-pay

## 2022-12-16 ENCOUNTER — Encounter: Payer: Self-pay | Admitting: Internal Medicine

## 2022-12-16 ENCOUNTER — Telehealth: Payer: Self-pay | Admitting: Internal Medicine

## 2022-12-16 ENCOUNTER — Other Ambulatory Visit: Payer: Self-pay

## 2022-12-16 MED ORDER — BISMUTH/METRONIDAZ/TETRACYCLIN 140-125-125 MG PO CAPS: 3.0000 | ORAL_CAPSULE | Freq: Four times a day (QID) | ORAL | 0 refills | Status: DC

## 2022-12-16 NOTE — Telephone Encounter (Signed)
Pt's niece (DPR on file) was made aware and verbalized understanding. Rx was sent to pharmacy on file.

## 2022-12-16 NOTE — Telephone Encounter (Signed)
A message was left to find out the pathology report from the procedure that was done last week.

## 2022-12-16 NOTE — Telephone Encounter (Signed)
See other telephone note.  

## 2022-12-16 NOTE — Telephone Encounter (Signed)
[place], Cinch Ormond M, CMA  Blane Ohara, Jennfer Gassen M, CMA Aalayah Riles, patient needs Pylera x 14 days 3 capsules 4 times daily for 14 days.  Increase Protonix to 40 mg twice daily during this time.  I know patient is having inguinal surgery coming up on 4/8.  I think it is going to be difficult for him to be in the middle of H. pylori treatment when he has the surgery.  High risk of failure to eradicate.  I think we should wait until a week after he has surgery to start this therapy.

## 2022-12-18 ENCOUNTER — Encounter (HOSPITAL_COMMUNITY): Payer: Self-pay

## 2022-12-18 ENCOUNTER — Encounter (HOSPITAL_COMMUNITY)
Admission: RE | Admit: 2022-12-18 | Discharge: 2022-12-18 | Disposition: A | Discharge status: Home / Self Care | Payer: Medicare Other | Source: Ambulatory Visit | Attending: General Surgery | Admitting: General Surgery

## 2022-12-20 ENCOUNTER — Encounter (HOSPITAL_COMMUNITY): Payer: Self-pay | Admitting: Internal Medicine

## 2022-12-20 NOTE — Addendum Note (Signed)
Addendum  created 12/20/22 1307 by Lorin Glass, CRNA   Intraprocedure Staff edited

## 2022-12-23 ENCOUNTER — Ambulatory Visit (HOSPITAL_COMMUNITY)
Admission: RE | Admit: 2022-12-23 | Discharge: 2022-12-23 | Disposition: A | Discharge status: Home / Self Care | Payer: Medicare Other | Attending: General Surgery | Admitting: General Surgery

## 2022-12-23 ENCOUNTER — Encounter (HOSPITAL_COMMUNITY): Payer: Self-pay | Admitting: General Surgery

## 2022-12-23 ENCOUNTER — Ambulatory Visit (HOSPITAL_COMMUNITY): Payer: Medicare Other | Admitting: Anesthesiology

## 2022-12-23 ENCOUNTER — Ambulatory Visit (HOSPITAL_BASED_OUTPATIENT_CLINIC_OR_DEPARTMENT_OTHER): Payer: Medicare Other | Admitting: Anesthesiology

## 2022-12-23 ENCOUNTER — Other Ambulatory Visit: Payer: Self-pay

## 2022-12-23 ENCOUNTER — Encounter (HOSPITAL_COMMUNITY)
Admission: RE | Disposition: A | Discharge status: Home / Self Care | Payer: Self-pay | Source: Home / Self Care | Attending: General Surgery

## 2022-12-23 DIAGNOSIS — N210 Calculus in bladder: Secondary | ICD-10-CM | POA: Insufficient documentation

## 2022-12-23 DIAGNOSIS — E785 Hyperlipidemia, unspecified: Secondary | ICD-10-CM | POA: Diagnosis not present

## 2022-12-23 DIAGNOSIS — E079 Disorder of thyroid, unspecified: Secondary | ICD-10-CM | POA: Diagnosis not present

## 2022-12-23 DIAGNOSIS — Z79890 Hormone replacement therapy (postmenopausal): Secondary | ICD-10-CM | POA: Insufficient documentation

## 2022-12-23 DIAGNOSIS — Z8546 Personal history of malignant neoplasm of prostate: Secondary | ICD-10-CM | POA: Insufficient documentation

## 2022-12-23 DIAGNOSIS — M109 Gout, unspecified: Secondary | ICD-10-CM | POA: Diagnosis not present

## 2022-12-23 DIAGNOSIS — K4020 Bilateral inguinal hernia, without obstruction or gangrene, not specified as recurrent: Secondary | ICD-10-CM | POA: Diagnosis not present

## 2022-12-23 DIAGNOSIS — K4030 Unilateral inguinal hernia, with obstruction, without gangrene, not specified as recurrent: Secondary | ICD-10-CM

## 2022-12-23 DIAGNOSIS — Z79899 Other long term (current) drug therapy: Secondary | ICD-10-CM | POA: Insufficient documentation

## 2022-12-23 DIAGNOSIS — N201 Calculus of ureter: Secondary | ICD-10-CM | POA: Insufficient documentation

## 2022-12-23 DIAGNOSIS — I10 Essential (primary) hypertension: Secondary | ICD-10-CM | POA: Diagnosis not present

## 2022-12-23 DIAGNOSIS — K4090 Unilateral inguinal hernia, without obstruction or gangrene, not specified as recurrent: Secondary | ICD-10-CM | POA: Diagnosis not present

## 2022-12-23 HISTORY — PX: INGUINAL HERNIA REPAIR: SHX194

## 2022-12-23 SURGERY — REPAIR, HERNIA, INGUINAL, ADULT
Anesthesia: General | Site: Inguinal | Laterality: Right

## 2022-12-23 MED ORDER — ORAL CARE MOUTH RINSE: 15.0000 mL | Freq: Once | OROMUCOSAL | Status: AC

## 2022-12-23 MED ORDER — PROPOFOL 10 MG/ML IV BOLUS
INTRAVENOUS | Status: DC | PRN
  Administered 2022-12-23: 100 mg via INTRAVENOUS

## 2022-12-23 MED ORDER — BUPIVACAINE HCL (PF) 0.5 % IJ SOLN
INTRAMUSCULAR | Status: DC | PRN
  Administered 2022-12-23: 30 mL

## 2022-12-23 MED ORDER — LACTATED RINGERS IV SOLN: INTRAVENOUS | Status: DC

## 2022-12-23 MED ORDER — PHENYLEPHRINE 80 MCG/ML (10ML) SYRINGE FOR IV PUSH (FOR BLOOD PRESSURE SUPPORT)
PREFILLED_SYRINGE | INTRAVENOUS | Status: AC
  Filled 2022-12-23: qty 10

## 2022-12-23 MED ORDER — LIDOCAINE HCL (PF) 2 % IJ SOLN
INTRAMUSCULAR | Status: AC
  Filled 2022-12-23: qty 5

## 2022-12-23 MED ORDER — SODIUM CHLORIDE 0.9 % IR SOLN
Status: DC | PRN
  Administered 2022-12-23: 1000 mL

## 2022-12-23 MED ORDER — OXYCODONE HCL 5 MG/5ML PO SOLN: 5.0000 mg | Freq: Once | ORAL | Status: DC | PRN

## 2022-12-23 MED ORDER — ONDANSETRON HCL 4 MG/2ML IJ SOLN
INTRAMUSCULAR | Status: AC
  Filled 2022-12-23: qty 2

## 2022-12-23 MED ORDER — CEFAZOLIN SODIUM-DEXTROSE 2-4 GM/100ML-% IV SOLN
INTRAVENOUS | Status: AC
  Filled 2022-12-23: qty 100

## 2022-12-23 MED ORDER — LIDOCAINE HCL (CARDIAC) PF 100 MG/5ML IV SOSY
PREFILLED_SYRINGE | INTRAVENOUS | Status: DC | PRN
  Administered 2022-12-23: 60 mg via INTRAVENOUS

## 2022-12-23 MED ORDER — OXYCODONE HCL 5 MG PO TABS: 5.0000 mg | ORAL_TABLET | ORAL | 0 refills | Status: AC | PRN

## 2022-12-23 MED ORDER — ROCURONIUM BROMIDE 100 MG/10ML IV SOLN
INTRAVENOUS | Status: DC | PRN
  Administered 2022-12-23: 70 mg via INTRAVENOUS

## 2022-12-23 MED ORDER — SUGAMMADEX SODIUM 500 MG/5ML IV SOLN
INTRAVENOUS | Status: DC | PRN
  Administered 2022-12-23: 200 mg via INTRAVENOUS

## 2022-12-23 MED ORDER — FENTANYL CITRATE (PF) 100 MCG/2ML IJ SOLN
INTRAMUSCULAR | Status: AC
  Filled 2022-12-23: qty 2

## 2022-12-23 MED ORDER — EPHEDRINE SULFATE (PRESSORS) 50 MG/ML IJ SOLN
INTRAMUSCULAR | Status: DC | PRN
  Administered 2022-12-23: 10 mg via INTRAVENOUS

## 2022-12-23 MED ORDER — FENTANYL CITRATE PF 50 MCG/ML IJ SOSY
25.0000 ug | PREFILLED_SYRINGE | INTRAMUSCULAR | Status: DC | PRN
  Administered 2022-12-23: 50 ug via INTRAVENOUS
  Filled 2022-12-23: qty 1

## 2022-12-23 MED ORDER — EPHEDRINE 5 MG/ML INJ
INTRAVENOUS | Status: AC
  Filled 2022-12-23: qty 5

## 2022-12-23 MED ORDER — ROCURONIUM BROMIDE 10 MG/ML (PF) SYRINGE
PREFILLED_SYRINGE | INTRAVENOUS | Status: AC
  Filled 2022-12-23: qty 10

## 2022-12-23 MED ORDER — PHENYLEPHRINE HCL (PRESSORS) 10 MG/ML IV SOLN
INTRAVENOUS | Status: DC | PRN
  Administered 2022-12-23: 80 ug via INTRAVENOUS

## 2022-12-23 MED ORDER — ONDANSETRON HCL 4 MG PO TABS: 4.0000 mg | ORAL_TABLET | Freq: Three times a day (TID) | ORAL | 1 refills | Status: AC | PRN

## 2022-12-23 MED ORDER — CHLORHEXIDINE GLUCONATE CLOTH 2 % EX PADS
6.0000 | MEDICATED_PAD | Freq: Once | CUTANEOUS | Status: AC
  Administered 2022-12-23: 6 via TOPICAL

## 2022-12-23 MED ORDER — ONDANSETRON HCL 4 MG/2ML IJ SOLN: 4.0000 mg | Freq: Once | INTRAMUSCULAR | Status: DC | PRN

## 2022-12-23 MED ORDER — BUPIVACAINE HCL (PF) 0.5 % IJ SOLN
INTRAMUSCULAR | Status: AC
  Filled 2022-12-23: qty 30

## 2022-12-23 MED ORDER — CHLORHEXIDINE GLUCONATE CLOTH 2 % EX PADS: 6.0000 | MEDICATED_PAD | Freq: Once | CUTANEOUS | Status: DC

## 2022-12-23 MED ORDER — CEFAZOLIN SODIUM-DEXTROSE 2-4 GM/100ML-% IV SOLN
2.0000 g | INTRAVENOUS | Status: AC
  Administered 2022-12-23: 2 g via INTRAVENOUS

## 2022-12-23 MED ORDER — FENTANYL CITRATE (PF) 100 MCG/2ML IJ SOLN
INTRAMUSCULAR | Status: DC | PRN
  Administered 2022-12-23 (×2): 25 ug via INTRAVENOUS

## 2022-12-23 MED ORDER — CHLORHEXIDINE GLUCONATE 0.12 % MT SOLN
15.0000 mL | Freq: Once | OROMUCOSAL | Status: AC
  Administered 2022-12-23: 15 mL via OROMUCOSAL

## 2022-12-23 MED ORDER — OXYCODONE HCL 5 MG PO TABS: 5.0000 mg | ORAL_TABLET | Freq: Once | ORAL | Status: DC | PRN

## 2022-12-23 SURGICAL SUPPLY — 31 items
ADH SKN CLS APL DERMABOND .7 (GAUZE/BANDAGES/DRESSINGS) ×1
CLOTH BEACON ORANGE TIMEOUT ST (SAFETY) ×1 IMPLANT
COVER LIGHT HANDLE (MISCELLANEOUS) IMPLANT
DERMABOND ADVANCED .7 DNX12 (GAUZE/BANDAGES/DRESSINGS) ×1 IMPLANT
DRAIN PENROSE 0.5X18 (DRAIN) ×1 IMPLANT
ELECT REM PT RETURN 9FT ADLT (ELECTROSURGICAL) ×1
ELECTRODE REM PT RTRN 9FT ADLT (ELECTROSURGICAL) ×1 IMPLANT
GAUZE SPONGE 4X4 12PLY STRL (GAUZE/BANDAGES/DRESSINGS) ×1 IMPLANT
GLOVE BIO SURGEON STRL SZ 6.5 (GLOVE) ×1 IMPLANT
GLOVE BIO SURGEON STRL SZ7 (GLOVE) IMPLANT
GLOVE BIOGEL PI IND STRL 6.5 (GLOVE) ×1 IMPLANT
GLOVE BIOGEL PI IND STRL 7.0 (GLOVE) ×2 IMPLANT
GLOVE ECLIPSE 6.5 STRL STRAW (GLOVE) IMPLANT
GOWN STRL REUS W/TWL LRG LVL3 (GOWN DISPOSABLE) ×3 IMPLANT
INST SET MINOR GENERAL (KITS) ×1 IMPLANT
KIT TURNOVER KIT A (KITS) ×1 IMPLANT
MANIFOLD NEPTUNE II (INSTRUMENTS) ×1 IMPLANT
MESH HERNIA 1.6X1.9 PLUG LRG (Mesh General) IMPLANT
NS IRRIG 1000ML POUR BTL (IV SOLUTION) ×1 IMPLANT
PACK MINOR (CUSTOM PROCEDURE TRAY) ×1 IMPLANT
PAD ARMBOARD 7.5X6 YLW CONV (MISCELLANEOUS) ×1 IMPLANT
SET BASIN LINEN APH (SET/KITS/TRAYS/PACK) ×1 IMPLANT
SOL PREP PROV IODINE SCRUB 4OZ (MISCELLANEOUS) ×1 IMPLANT
SUT MNCRL AB 4-0 PS2 18 (SUTURE) ×1 IMPLANT
SUT NOVA NAB GS-22 2 2-0 T-19 (SUTURE) IMPLANT
SUT VIC AB 2-0 CT1 27 (SUTURE) ×1
SUT VIC AB 2-0 CT1 TAPERPNT 27 (SUTURE) ×1 IMPLANT
SUT VIC AB 3-0 SH 27 (SUTURE) ×1
SUT VIC AB 3-0 SH 27X BRD (SUTURE) ×1 IMPLANT
SUT VICRYL AB 3 0 TIES (SUTURE) IMPLANT
SYR 30ML LL (SYRINGE) ×1 IMPLANT

## 2022-12-23 NOTE — Transfer of Care (Signed)
Immediate Anesthesia Transfer of Care Note  Patient: Grant Martinez  Procedure(s) Performed: HERNIA REPAIR INGUINAL ADULT W/ MESH (Right: Inguinal)  Patient Location: PACU  Anesthesia Type:General  Level of Consciousness: drowsy and patient cooperative  Airway & Oxygen Therapy: Patient Spontanous Breathing and Patient connected to nasal cannula oxygen  Post-op Assessment: Report given to RN and Post -op Vital signs reviewed and stable  Post vital signs: Reviewed and stable  Last Vitals:  Vitals Value Taken Time  BP 153/72 12/23/22 1046  Temp 98.4 12/23/22 1046  Pulse 75 12/23/22 1045  Resp 14 12/23/22 1045  SpO2 100 % 12/23/22 1045  Vitals shown include unvalidated device data.  Last Pain:  Vitals:   12/23/22 0759  TempSrc: Oral  PainSc: 0-No pain         Complications: No notable events documented.

## 2022-12-23 NOTE — Discharge Instructions (Signed)
Discharge Instructions Hernia:  Common Complaints: Pain at the incision site is common. This will improve with time. Take your pain medications as described below. Some nausea is common and poor appetite. The main goal is to stay hydrated the first few days after surgery.  Numbness at the incision or the thigh is common.  If you start to have burning or tingling pain in your groin, this is from a nerve being pinched. Please call and we can prescribe you a different type of pain medication for nerve pain.  Bruising in the side of the repair and down into the scrotum on that side is normal, if the bruising is spreading, call the office.  Some swelling is normal, place a towel under your scrotum (folded into a square) to help with elevation of the scrotum and swelling. Ice the side for the first 72 hours.   Diet/ Activity: Diet as tolerated. You may not have an appetite, but it is important to stay hydrated. Drink 64 ounces of water a day. Your appetite will return with time.  Shower per your regular routine daily.  Do not take hot showers. Take warm showers that are less than 10 minutes. Rest and listen to your body, but do not remain in bed all day.  Walk everyday for at least 15-20 minutes. Deep cough and move around every 1-2 hours in the first few days after surgery.  Do not pick at the dermabond glue on your incision sites.  This glue film will remain in place for 1-2 weeks and will start to peel off.  Do not place lotions or balms on your incision unless instructed to specifically by Dr. Kolette Vey.  Do not lift > 10 lbs, perform excessive bending, pushing, pulling, squatting for 6-8 weeks after surgery.   Pain Expectations and Narcotics: -After surgery you will have pain associated with your incisions and this is normal. The pain is muscular and nerve pain, and will get better with time. -You are encouraged and expected to take non narcotic medications like tylenol and ibuprofen (when able)  to treat pain as multiple modalities can aid with pain treatment. -Narcotics are only used when pain is severe or there is breakthrough pain. -You are not expected to have a pain score of 0 after surgery, as we cannot prevent pain. A pain score of 3-4 that allows you to be functional, move, walk, and tolerate some activity is the goal. The pain will continue to improve over the days after surgery and is dependent on your surgery. -Due to [place] law, we are only able to give a certain amount of pain medication to treat post operative pain, and we only give additional narcotics on a patient by patient basis.  -For most laparoscopic surgery, studies have shown that the majority of patients only need 10-15 narcotic pills, and for open surgeries most patients only need 15-20.   -Having appropriate expectations of pain and knowledge of pain management with non narcotics is important as we do not want anyone to become addicted to narcotic pain medication.  -Using ice packs in the first 48 hours and heating pads after 48 hours, wearing an abdominal binder (when recommended), and using over the counter medications are all ways to help with pain management.   -Simple acts like meditation and mindfulness practices after surgery can also help with pain control and research has proven the benefit of these practices.  Medication: Take tylenol and ibuprofen as needed for pain control, alternating every 4-6 hours.    Example:  Tylenol 1000mg @ 6am, 12noon, 6pm, 12midnight (Do not exceed 4000mg of tylenol a day). Ibuprofen 800mg @ 9am, 3pm, 9pm, 3am (Do not exceed 3600mg of ibuprofen a day).  Take Roxicodone for breakthrough pain every 4 hours.  Take Colace for constipation related to narcotic pain medication. If you do not have a bowel movement in 2 days, take Miralax over the counter.  Drink plenty of water to also prevent constipation.   Contact Information: If you have questions or concerns, please call our office,  [phone], Monday- Thursday 8AM-5PM and Friday 8AM-12Noon.  If it is after hours or on the weekend, please call [facility] Main [facility], [phone], [phone], and ask to speak to the surgeon on call for Dr. Prabhleen Montemayor at [place].   

## 2022-12-23 NOTE — Anesthesia Preprocedure Evaluation (Signed)
Anesthesia Evaluation  Patient identified by MRN, date of birth, ID band Patient awake    Reviewed: Allergy & Precautions, H&P , NPO status , Patient's Chart, lab work & pertinent test results, reviewed documented beta blocker date and time   Airway Mallampati: II  TM Distance: >3 FB Neck ROM: full    Dental no notable dental hx.    Pulmonary neg pulmonary ROS   Pulmonary exam normal breath sounds clear to auscultation       Cardiovascular Exercise Tolerance: Good hypertension, negative cardio ROS  Rhythm:regular Rate:Normal     Neuro/Psych  Neuromuscular disease negative neurological ROS  negative psych ROS   GI/Hepatic negative GI ROS, Neg liver ROS,,,  Endo/Other  negative endocrine ROS    Renal/GU negative Renal ROS  negative genitourinary   Musculoskeletal   Abdominal   Peds  Hematology negative hematology ROS (+)   Anesthesia Other Findings   Reproductive/Obstetrics negative OB ROS                             Anesthesia Physical Anesthesia Plan  ASA: 2  Anesthesia Plan: General and General ETT   Post-op Pain Management:    Induction:   PONV Risk Score and Plan: Ondansetron  Airway Management Planned:   Additional Equipment:   Intra-op Plan:   Post-operative Plan:   Informed Consent: I have reviewed the patients History and Physical, chart, labs and discussed the procedure including the risks, benefits and alternatives for the proposed anesthesia with the patient or authorized representative who has indicated his/her understanding and acceptance.     Dental Advisory Given  Plan Discussed with: CRNA  Anesthesia Plan Comments:        Anesthesia Quick Evaluation

## 2022-12-23 NOTE — Anesthesia Procedure Notes (Signed)
Procedure Name: Intubation Date/Time: 12/23/2022 9:27 AM  Performed by: Franco Nones, CRNAPre-anesthesia Checklist: Patient identified, Patient being monitored, Timeout performed, Emergency Drugs available and Suction available Patient Re-evaluated:Patient Re-evaluated prior to induction Oxygen Delivery Method: Circle system utilized Preoxygenation: Pre-oxygenation with 100% oxygen Induction Type: IV induction Ventilation: Mask ventilation without difficulty Laryngoscope Size: Mac and 3 Grade View: Grade I Tube type: Oral Tube size: 7.5 mm Number of attempts: 1 Airway Equipment and Method: Stylet Placement Confirmation: ETT inserted through vocal cords under direct vision, positive ETCO2 and breath sounds checked- equal and bilateral Secured at: 22 cm Tube secured with: Tape Dental Injury: Teeth and Oropharynx as per pre-operative assessment

## 2022-12-23 NOTE — Op Note (Signed)
[facility] Operative Note  12/23/22  Preoperative Diagnosis: Right inguinal hernia    Postoperative Diagnosis: Same   Procedure(s) Performed: Right inguinal hernia repair with mesh   Surgeon: Leatrice Jewels. Henreitta Leber, MD   Assistants: No qualified resident was available   Anesthesia: General endotracheal   Anesthesiologist: Windell Norfolk, MD    Specimens: None   Estimated Blood Loss: Minimal   Blood Replacement: None    Complications: None   Wound Class: Clean   Operative Indications: Mr. Rathgeb is a 89 yo who has bilateral inguinal hernias but is symptomatic on the right. We discussed repair on the right given his age and limited activity to reduce the risk of incarceration again. We discussed the risk of bleeding, infection, mesh use, recurrence, injury to cord or nerve structures. He opted to proceed.   Findings: Large right inguinal hernia, indirect    Procedure: The patient was taken to the operating room and placed supine. General endotracheal anesthesia was induced. Intravenous antibiotics were  administered per protocol.  A time out was preformed verifying the correct patient, procedure, site, positioning and implants.  The right groin and scrotum were prepared and draped in the usual sterile fashion.   An incision was made in a natural skin crease between the pubic tubercle and the anterior superior iliac spine.  The incision was deepened with electrocautery through Scarpa's and Camper's fascia until the aponeurosis of the external oblique was encountered.  This was cleaned and the external ring was exposed.  An incision was made in the midportion of the external oblique aponeurosis in the direction of its fibers. The ilioinguinal nerve was  identified and was protected throughout the dissection.  Flaps of the external oblique were developed cephalad and inferiorly.    The cord was identified and it was gently dissented free at the pubic tubercle and  encircled with a Penrose drain.  Attention was then directed at the anteromedial aspect of the cord, where a large indirect hernia sac was identified.  The sac was carefully dissected free from the cord down to the level of the internal ring.  The vas and testicular vessels were identified and protected from harm.  Once the sac was dissected free from the cords, the Penrose was placed around the cord which was retracted inferiorly out of the field of view.  The hernia was reduced into the internal ring without difficulty.  A two large Perfix Plug was placed into the defect and filled the space.  They were secured to each other and the internal ring with 2-0 Novafil. Attention was then turned to the floor of the canal, which was grossly weakened without any defined defect or sac.  The Perfix Mesh Patch was sutured to the inguinal ligament inferiorly starting at the pubic tubercle using 2-0 Novafil interrupted sutures.  The mesh was sutured superiorly to the conjoint tendon using 2-0 Novafil interrupted sutures.  Care was taken to ensure the mesh was placed in a relaxed fashion to avoid excessive tension and no neurovascular structures were caught in the repair.  Laterally the tails of the mesh were crossed and the internal ring was recreated, allowing for passage of cords without tension.   Hemostasis was adequate.  The Penrose was removed.  The external oblique aponeurosis was closed with a 2-0 Vicryl suture in a running fashion, taking care to not catch the ilioinguinal nerve in the suture line.  Scarpa's fashion was closed with 3-0 Vicryl interrupted sutures. A bupivacine block and  local injection was performed. The skin was closed with a subcuticular 4-0 Monocryl suture.  Dermabond was applied.   The testis was gently pulled down into its anatomic position in the scrotum.  The patient tolerated the procedure well and was taken to the PACU in stable condition. All counts were correct at the end of the case.         Algis Greenhouse, MD [facility] [address] [place], [place] [address] [phone] (office)

## 2022-12-23 NOTE — Progress Notes (Signed)
[facility]  Updated niece. Rx to [facility] in [place]. Will see in 1 month. Need to urinate before discharge home.   Algis Greenhouse, MD [facility] [address] [place], [place] [address] [phone] (office)

## 2022-12-23 NOTE — Interval H&P Note (Signed)
History and Physical Interval Note:  12/23/2022 9:11 AM  Grant Martinez  has presented today for surgery, with the diagnosis of INGUINAL HERNIA, INCARCERATED, RIGHT.  The various methods of treatment have been discussed with the patient and family. After consideration of risks, benefits and other options for treatment, the patient has consented to  Procedure(s) with comments: HERNIA REPAIR INGUINAL ADULT W/ MESH (Right) - spoke with pt's niece, he is not a morning person but will do their best to be there by 7:30 - 7:45 as a surgical intervention.  The patient's history has been reviewed, patient examined, no change in status, stable for surgery.  I have reviewed the patient's chart and labs.  Questions were answered to the patient's satisfaction.    Right side repair. No changes.  [name]

## 2022-12-24 NOTE — Anesthesia Postprocedure Evaluation (Signed)
Anesthesia Post Note  Patient: Grant Martinez  Procedure(s) Performed: HERNIA REPAIR INGUINAL ADULT W/ MESH (Right: Inguinal)  Patient location during evaluation: Phase II Anesthesia Type: General Level of consciousness: awake Pain management: pain level controlled Vital Signs Assessment: post-procedure vital signs reviewed and stable Respiratory status: spontaneous breathing and respiratory function stable Cardiovascular status: blood pressure returned to baseline and stable Postop Assessment: no headache and no apparent nausea or vomiting Anesthetic complications: no Comments: Late entry   No notable events documented.   Last Vitals:  Vitals:   12/23/22 1130 12/23/22 1151  BP: (!) 154/79 (!) 161/89  Pulse: 65 68  Resp: 16 18  Temp:  36.5 C  SpO2: 95% 98%    Last Pain:  Vitals:   12/23/22 1151  TempSrc: Oral  PainSc: 4                  Windell Norfolk

## 2022-12-31 ENCOUNTER — Encounter (HOSPITAL_COMMUNITY): Payer: Self-pay | Admitting: General Surgery

## 2023-01-05 IMAGING — PT NM PET TUM IMG SKULL BASE T - THIGH
1 series · 1 of 1 positions shown · non-contrast
Comparison: None.

CLINICAL DATA: Prostate carcinoma with biochemical recurrence. PSA
by

EXAM:
NUCLEAR MEDICINE PET SKULL BASE TO THIGH
TECHNIQUE: 9.7 mCi F18 Piflufolastat (Pylarify) was injected intravenously.
Full-ring PET imaging was performed from the skull base to thigh
after the radiotracer. CT data was obtained and used for attenuation
correction and anatomic localization.

[Series 1153: results mm oncology reading · 1.0mm · 0.45mm/px · 1 of 1 slices shown]
[im 1/1]
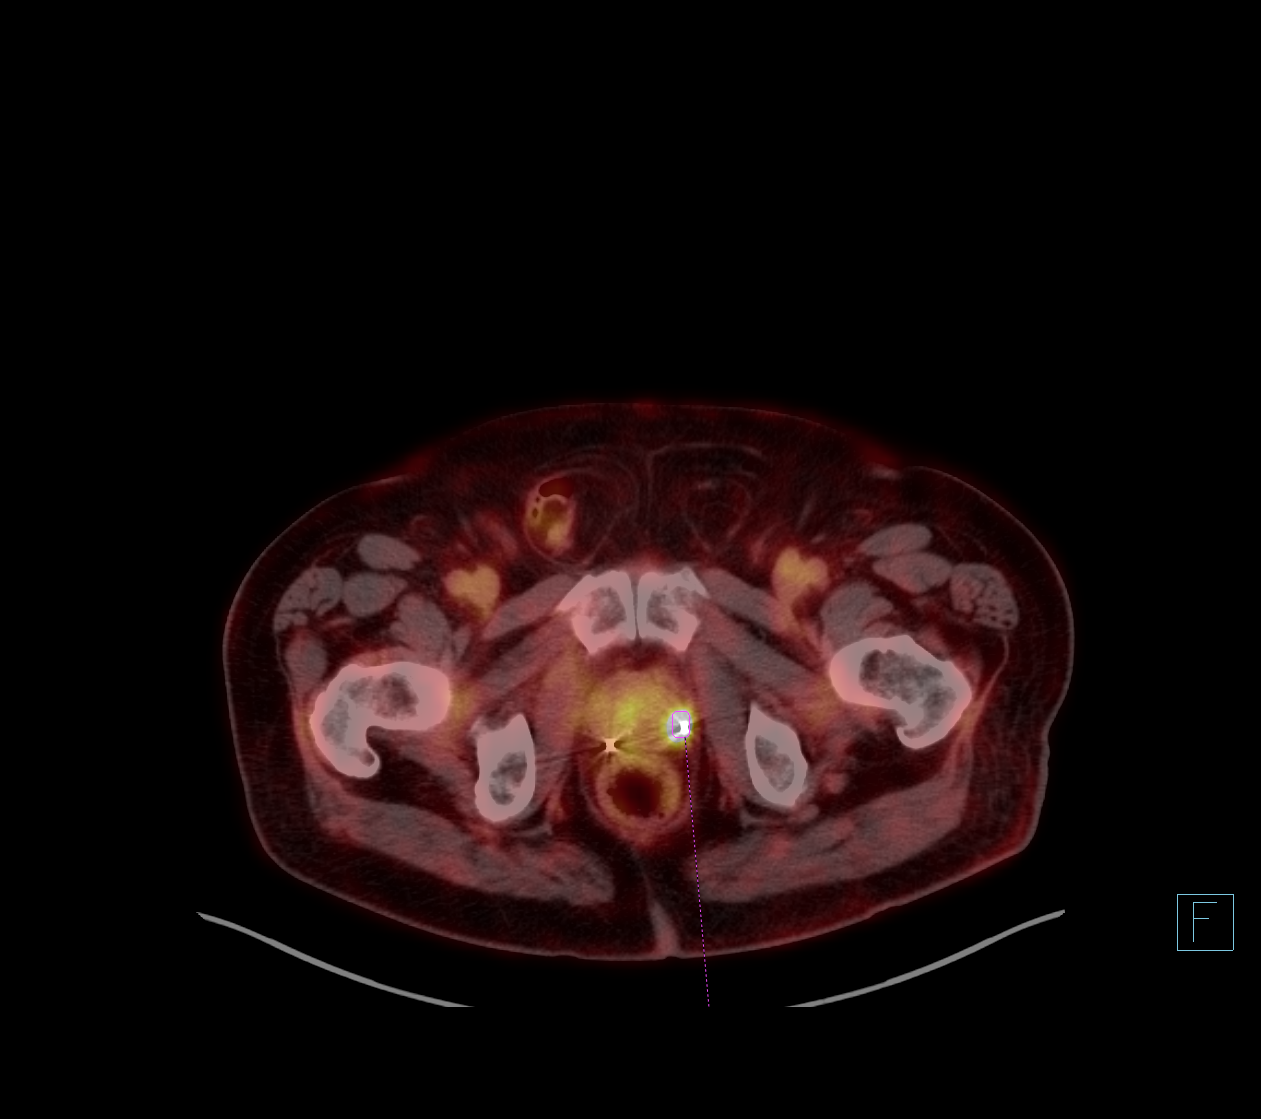

[1 of 1 positions shown; findings below may reference images not displayed]

FINDINGS: NECK

No radiotracer activity in neck lymph nodes.

Incidental CT finding: None

CHEST

No radiotracer accumulation within mediastinal or hilar lymph nodes.
No suspicious pulmonary nodules on the CT scan.

Incidental CT finding: None

ABDOMEN/PELVIS

Prostate: Focus of intense radiotracer activity in the LEFT mid
gland with SUV max equal 12.7. This focus measures approximately
10-15 mm.

Lymph nodes: No abnormal radiotracer accumulation within pelvic or
abdominal nodes.

Liver: No evidence of liver metastasis

Incidental CT finding: RIGHT pelvic kidney. Small gallstone.
Bilateral fat filled inguinal hernias. RIGHT hernia sac contains a
nonobstructed loop of small bowel.

SKELETON

No focal  activity to suggest skeletal metastasis.
IMPRESSION: 1. Focal activity in the LEFT mid prostate gland concerning for
residual/recurrent prostate carcinoma.
2. No evidence of metastatic adenopathy in the abdomen pelvis.
3. No evidence of visceral metastasis or skeletal metastasis.
4. Incidental finding of a RIGHT inguinal hernia contains a
nonobstructed loop of small bowel.

## 2023-01-08 DIAGNOSIS — L609 Nail disorder, unspecified: Secondary | ICD-10-CM | POA: Diagnosis not present

## 2023-01-08 DIAGNOSIS — M79671 Pain in right foot: Secondary | ICD-10-CM | POA: Diagnosis not present

## 2023-01-08 DIAGNOSIS — I739 Peripheral vascular disease, unspecified: Secondary | ICD-10-CM | POA: Diagnosis not present

## 2023-01-08 DIAGNOSIS — M79672 Pain in left foot: Secondary | ICD-10-CM | POA: Diagnosis not present

## 2023-01-22 ENCOUNTER — Encounter: Payer: Self-pay | Admitting: General Surgery

## 2023-01-22 ENCOUNTER — Ambulatory Visit (INDEPENDENT_AMBULATORY_CARE_PROVIDER_SITE_OTHER): Payer: Medicare Other | Admitting: General Surgery

## 2023-01-22 VITALS — BP 147/86 | HR 92 | Temp 98.3°F | Resp 16 | Ht 68.0 in | Wt 175.0 lb

## 2023-01-22 DIAGNOSIS — K4090 Unilateral inguinal hernia, without obstruction or gangrene, not specified as recurrent: Secondary | ICD-10-CM

## 2023-01-22 NOTE — Patient Instructions (Addendum)
No heavy lifting > 10 lbs, excessive bending, pushing, pulling, or squatting for 8 weeks after surgery.  Ok to do leg lifts and walk in therapy.   Call with issues.

## 2023-01-24 NOTE — Progress Notes (Signed)
[facility]   Doing well. Having some soreness but nothing major.  BP (!) 147/86   Pulse 92   Temp 98.3 F (36.8 C) (Oral)   Resp 16   Ht 5\' 8"  (1.727 m)   Wt 175 lb (79.4 kg)   SpO2 95%   BMI 26.61 kg/m  Inguinal hernia site c/d/I with no erythema or drainage, right side, minor induration   Patient s/p open right inguinal hernia repair with mesh.   No heavy lifting > 10 lbs, excessive bending, pushing, pulling, or squatting for 8 weeks after surgery.  Ok to do leg lifts and walk in therapy.   Call with issues.   PRN follow up .  Algis Greenhouse, MD [facility] [address] [place], [place] [address] [phone] (office)

## 2023-01-29 DIAGNOSIS — R7303 Prediabetes: Secondary | ICD-10-CM | POA: Diagnosis not present

## 2023-01-29 DIAGNOSIS — M1000 Idiopathic gout, unspecified site: Secondary | ICD-10-CM | POA: Diagnosis not present

## 2023-01-29 DIAGNOSIS — E559 Vitamin D deficiency, unspecified: Secondary | ICD-10-CM | POA: Diagnosis not present

## 2023-01-29 DIAGNOSIS — E039 Hypothyroidism, unspecified: Secondary | ICD-10-CM | POA: Diagnosis not present

## 2023-01-29 DIAGNOSIS — R739 Hyperglycemia, unspecified: Secondary | ICD-10-CM | POA: Diagnosis not present

## 2023-01-29 DIAGNOSIS — E7849 Other hyperlipidemia: Secondary | ICD-10-CM | POA: Diagnosis not present

## 2023-02-05 DIAGNOSIS — M170 Bilateral primary osteoarthritis of knee: Secondary | ICD-10-CM | POA: Diagnosis not present

## 2023-02-05 DIAGNOSIS — Z0001 Encounter for general adult medical examination with abnormal findings: Secondary | ICD-10-CM | POA: Diagnosis not present

## 2023-02-05 DIAGNOSIS — R296 Repeated falls: Secondary | ICD-10-CM | POA: Diagnosis not present

## 2023-02-05 DIAGNOSIS — N1831 Chronic kidney disease, stage 3a: Secondary | ICD-10-CM | POA: Diagnosis not present

## 2023-02-05 DIAGNOSIS — I6203 Nontraumatic chronic subdural hemorrhage: Secondary | ICD-10-CM | POA: Diagnosis not present

## 2023-02-28 DIAGNOSIS — R9721 Rising PSA following treatment for malignant neoplasm of prostate: Secondary | ICD-10-CM | POA: Diagnosis not present

## 2023-03-04 DIAGNOSIS — N130 Hydronephrosis with ureteropelvic junction obstruction: Secondary | ICD-10-CM | POA: Diagnosis not present

## 2023-03-04 DIAGNOSIS — N281 Cyst of kidney, acquired: Secondary | ICD-10-CM | POA: Diagnosis not present

## 2023-03-04 DIAGNOSIS — N1330 Unspecified hydronephrosis: Secondary | ICD-10-CM | POA: Diagnosis not present

## 2023-03-13 DIAGNOSIS — C61 Malignant neoplasm of prostate: Secondary | ICD-10-CM | POA: Diagnosis not present

## 2023-03-13 DIAGNOSIS — N130 Hydronephrosis with ureteropelvic junction obstruction: Secondary | ICD-10-CM | POA: Diagnosis not present

## 2023-03-27 DIAGNOSIS — M79672 Pain in left foot: Secondary | ICD-10-CM | POA: Diagnosis not present

## 2023-03-27 DIAGNOSIS — L609 Nail disorder, unspecified: Secondary | ICD-10-CM | POA: Diagnosis not present

## 2023-03-27 DIAGNOSIS — I6203 Nontraumatic chronic subdural hemorrhage: Secondary | ICD-10-CM | POA: Diagnosis not present

## 2023-03-27 DIAGNOSIS — R296 Repeated falls: Secondary | ICD-10-CM | POA: Diagnosis not present

## 2023-03-27 DIAGNOSIS — M170 Bilateral primary osteoarthritis of knee: Secondary | ICD-10-CM | POA: Diagnosis not present

## 2023-03-27 DIAGNOSIS — I739 Peripheral vascular disease, unspecified: Secondary | ICD-10-CM | POA: Diagnosis not present

## 2023-03-27 DIAGNOSIS — M79671 Pain in right foot: Secondary | ICD-10-CM | POA: Diagnosis not present

## 2023-03-27 DIAGNOSIS — Q670 Congenital facial asymmetry: Secondary | ICD-10-CM | POA: Diagnosis not present

## 2023-03-28 DIAGNOSIS — M25571 Pain in right ankle and joints of right foot: Secondary | ICD-10-CM | POA: Diagnosis not present

## 2023-03-28 DIAGNOSIS — M7731 Calcaneal spur, right foot: Secondary | ICD-10-CM | POA: Diagnosis not present

## 2023-04-01 DIAGNOSIS — K080 Exfoliation of teeth due to systemic causes: Secondary | ICD-10-CM | POA: Diagnosis not present

## 2023-04-07 DIAGNOSIS — I6200 Nontraumatic subdural hemorrhage, unspecified: Secondary | ICD-10-CM | POA: Diagnosis not present

## 2023-04-07 DIAGNOSIS — R531 Weakness: Secondary | ICD-10-CM | POA: Diagnosis not present

## 2023-04-07 DIAGNOSIS — Z8719 Personal history of other diseases of the digestive system: Secondary | ICD-10-CM | POA: Diagnosis not present

## 2023-04-07 DIAGNOSIS — I6203 Nontraumatic chronic subdural hemorrhage: Secondary | ICD-10-CM | POA: Diagnosis not present

## 2023-04-07 DIAGNOSIS — Z8546 Personal history of malignant neoplasm of prostate: Secondary | ICD-10-CM | POA: Diagnosis not present

## 2023-04-07 DIAGNOSIS — R296 Repeated falls: Secondary | ICD-10-CM | POA: Diagnosis not present

## 2023-04-07 DIAGNOSIS — G319 Degenerative disease of nervous system, unspecified: Secondary | ICD-10-CM | POA: Diagnosis not present

## 2023-04-07 DIAGNOSIS — Z923 Personal history of irradiation: Secondary | ICD-10-CM | POA: Diagnosis not present

## 2023-04-07 DIAGNOSIS — Q670 Congenital facial asymmetry: Secondary | ICD-10-CM | POA: Diagnosis not present

## 2023-04-29 DIAGNOSIS — R739 Hyperglycemia, unspecified: Secondary | ICD-10-CM | POA: Diagnosis not present

## 2023-04-29 DIAGNOSIS — E039 Hypothyroidism, unspecified: Secondary | ICD-10-CM | POA: Diagnosis not present

## 2023-04-29 DIAGNOSIS — E782 Mixed hyperlipidemia: Secondary | ICD-10-CM | POA: Diagnosis not present

## 2023-04-29 DIAGNOSIS — I10 Essential (primary) hypertension: Secondary | ICD-10-CM | POA: Diagnosis not present

## 2023-05-06 DIAGNOSIS — R296 Repeated falls: Secondary | ICD-10-CM | POA: Diagnosis not present

## 2023-05-06 DIAGNOSIS — I6203 Nontraumatic chronic subdural hemorrhage: Secondary | ICD-10-CM | POA: Diagnosis not present

## 2023-05-06 DIAGNOSIS — N1831 Chronic kidney disease, stage 3a: Secondary | ICD-10-CM | POA: Diagnosis not present

## 2023-05-06 DIAGNOSIS — R7303 Prediabetes: Secondary | ICD-10-CM | POA: Diagnosis not present

## 2023-06-04 DIAGNOSIS — I739 Peripheral vascular disease, unspecified: Secondary | ICD-10-CM | POA: Diagnosis not present

## 2023-06-04 DIAGNOSIS — L110 Acquired keratosis follicularis: Secondary | ICD-10-CM | POA: Diagnosis not present

## 2023-06-04 DIAGNOSIS — B351 Tinea unguium: Secondary | ICD-10-CM | POA: Diagnosis not present

## 2023-06-04 DIAGNOSIS — M79671 Pain in right foot: Secondary | ICD-10-CM | POA: Diagnosis not present

## 2023-06-10 DIAGNOSIS — R9721 Rising PSA following treatment for malignant neoplasm of prostate: Secondary | ICD-10-CM | POA: Diagnosis not present

## 2023-06-17 DIAGNOSIS — R350 Frequency of micturition: Secondary | ICD-10-CM | POA: Diagnosis not present

## 2023-06-17 DIAGNOSIS — C61 Malignant neoplasm of prostate: Secondary | ICD-10-CM | POA: Diagnosis not present

## 2023-06-26 DIAGNOSIS — Z23 Encounter for immunization: Secondary | ICD-10-CM | POA: Diagnosis not present

## 2023-08-04 DIAGNOSIS — R7303 Prediabetes: Secondary | ICD-10-CM | POA: Diagnosis not present

## 2023-08-04 DIAGNOSIS — E7849 Other hyperlipidemia: Secondary | ICD-10-CM | POA: Diagnosis not present

## 2023-08-04 DIAGNOSIS — N1831 Chronic kidney disease, stage 3a: Secondary | ICD-10-CM | POA: Diagnosis not present

## 2023-08-20 DIAGNOSIS — L609 Nail disorder, unspecified: Secondary | ICD-10-CM | POA: Diagnosis not present

## 2023-08-20 DIAGNOSIS — M79671 Pain in right foot: Secondary | ICD-10-CM | POA: Diagnosis not present

## 2023-08-20 DIAGNOSIS — M79672 Pain in left foot: Secondary | ICD-10-CM | POA: Diagnosis not present

## 2023-08-20 DIAGNOSIS — I739 Peripheral vascular disease, unspecified: Secondary | ICD-10-CM | POA: Diagnosis not present

## 2023-08-21 DIAGNOSIS — N1831 Chronic kidney disease, stage 3a: Secondary | ICD-10-CM | POA: Diagnosis not present

## 2023-08-21 DIAGNOSIS — I6203 Nontraumatic chronic subdural hemorrhage: Secondary | ICD-10-CM | POA: Diagnosis not present

## 2023-08-21 DIAGNOSIS — R7303 Prediabetes: Secondary | ICD-10-CM | POA: Diagnosis not present

## 2023-08-21 DIAGNOSIS — R296 Repeated falls: Secondary | ICD-10-CM | POA: Diagnosis not present

## 2023-08-26 DIAGNOSIS — K5790 Diverticulosis of intestine, part unspecified, without perforation or abscess without bleeding: Secondary | ICD-10-CM | POA: Diagnosis not present

## 2023-08-26 DIAGNOSIS — I129 Hypertensive chronic kidney disease with stage 1 through stage 4 chronic kidney disease, or unspecified chronic kidney disease: Secondary | ICD-10-CM | POA: Diagnosis not present

## 2023-08-26 DIAGNOSIS — E782 Mixed hyperlipidemia: Secondary | ICD-10-CM | POA: Diagnosis not present

## 2023-08-26 DIAGNOSIS — N1831 Chronic kidney disease, stage 3a: Secondary | ICD-10-CM | POA: Diagnosis not present

## 2023-08-26 DIAGNOSIS — Z8616 Personal history of COVID-19: Secondary | ICD-10-CM | POA: Diagnosis not present

## 2023-08-26 DIAGNOSIS — E039 Hypothyroidism, unspecified: Secondary | ICD-10-CM | POA: Diagnosis not present

## 2023-08-26 DIAGNOSIS — Z9181 History of falling: Secondary | ICD-10-CM | POA: Diagnosis not present

## 2023-08-26 DIAGNOSIS — E559 Vitamin D deficiency, unspecified: Secondary | ICD-10-CM | POA: Diagnosis not present

## 2023-08-26 DIAGNOSIS — M25462 Effusion, left knee: Secondary | ICD-10-CM | POA: Diagnosis not present

## 2023-08-26 DIAGNOSIS — Z860100 Personal history of colon polyps, unspecified: Secondary | ICD-10-CM | POA: Diagnosis not present

## 2023-08-26 DIAGNOSIS — Z8546 Personal history of malignant neoplasm of prostate: Secondary | ICD-10-CM | POA: Diagnosis not present

## 2023-08-26 DIAGNOSIS — R7303 Prediabetes: Secondary | ICD-10-CM | POA: Diagnosis not present

## 2023-08-26 DIAGNOSIS — Z87820 Personal history of traumatic brain injury: Secondary | ICD-10-CM | POA: Diagnosis not present

## 2023-08-26 DIAGNOSIS — M170 Bilateral primary osteoarthritis of knee: Secondary | ICD-10-CM | POA: Diagnosis not present

## 2023-08-26 DIAGNOSIS — M109 Gout, unspecified: Secondary | ICD-10-CM | POA: Diagnosis not present

## 2023-08-26 DIAGNOSIS — Z556 Problems related to health literacy: Secondary | ICD-10-CM | POA: Diagnosis not present

## 2023-09-03 DIAGNOSIS — M109 Gout, unspecified: Secondary | ICD-10-CM | POA: Diagnosis not present

## 2023-09-03 DIAGNOSIS — Z87820 Personal history of traumatic brain injury: Secondary | ICD-10-CM | POA: Diagnosis not present

## 2023-09-03 DIAGNOSIS — E039 Hypothyroidism, unspecified: Secondary | ICD-10-CM | POA: Diagnosis not present

## 2023-09-03 DIAGNOSIS — Z8546 Personal history of malignant neoplasm of prostate: Secondary | ICD-10-CM | POA: Diagnosis not present

## 2023-09-03 DIAGNOSIS — Z860100 Personal history of colon polyps, unspecified: Secondary | ICD-10-CM | POA: Diagnosis not present

## 2023-09-03 DIAGNOSIS — R7303 Prediabetes: Secondary | ICD-10-CM | POA: Diagnosis not present

## 2023-09-03 DIAGNOSIS — Z9181 History of falling: Secondary | ICD-10-CM | POA: Diagnosis not present

## 2023-09-03 DIAGNOSIS — M170 Bilateral primary osteoarthritis of knee: Secondary | ICD-10-CM | POA: Diagnosis not present

## 2023-09-03 DIAGNOSIS — Z556 Problems related to health literacy: Secondary | ICD-10-CM | POA: Diagnosis not present

## 2023-09-03 DIAGNOSIS — K5790 Diverticulosis of intestine, part unspecified, without perforation or abscess without bleeding: Secondary | ICD-10-CM | POA: Diagnosis not present

## 2023-09-03 DIAGNOSIS — M25462 Effusion, left knee: Secondary | ICD-10-CM | POA: Diagnosis not present

## 2023-09-03 DIAGNOSIS — E782 Mixed hyperlipidemia: Secondary | ICD-10-CM | POA: Diagnosis not present

## 2023-09-03 DIAGNOSIS — E559 Vitamin D deficiency, unspecified: Secondary | ICD-10-CM | POA: Diagnosis not present

## 2023-09-03 DIAGNOSIS — N1831 Chronic kidney disease, stage 3a: Secondary | ICD-10-CM | POA: Diagnosis not present

## 2023-09-03 DIAGNOSIS — I129 Hypertensive chronic kidney disease with stage 1 through stage 4 chronic kidney disease, or unspecified chronic kidney disease: Secondary | ICD-10-CM | POA: Diagnosis not present

## 2023-09-03 DIAGNOSIS — Z8616 Personal history of COVID-19: Secondary | ICD-10-CM | POA: Diagnosis not present

## 2023-09-05 DIAGNOSIS — E559 Vitamin D deficiency, unspecified: Secondary | ICD-10-CM | POA: Diagnosis not present

## 2023-09-05 DIAGNOSIS — M109 Gout, unspecified: Secondary | ICD-10-CM | POA: Diagnosis not present

## 2023-09-05 DIAGNOSIS — Z860100 Personal history of colon polyps, unspecified: Secondary | ICD-10-CM | POA: Diagnosis not present

## 2023-09-05 DIAGNOSIS — I129 Hypertensive chronic kidney disease with stage 1 through stage 4 chronic kidney disease, or unspecified chronic kidney disease: Secondary | ICD-10-CM | POA: Diagnosis not present

## 2023-09-05 DIAGNOSIS — K5790 Diverticulosis of intestine, part unspecified, without perforation or abscess without bleeding: Secondary | ICD-10-CM | POA: Diagnosis not present

## 2023-09-05 DIAGNOSIS — Z556 Problems related to health literacy: Secondary | ICD-10-CM | POA: Diagnosis not present

## 2023-09-05 DIAGNOSIS — Z9181 History of falling: Secondary | ICD-10-CM | POA: Diagnosis not present

## 2023-09-05 DIAGNOSIS — Z8546 Personal history of malignant neoplasm of prostate: Secondary | ICD-10-CM | POA: Diagnosis not present

## 2023-09-05 DIAGNOSIS — M25462 Effusion, left knee: Secondary | ICD-10-CM | POA: Diagnosis not present

## 2023-09-05 DIAGNOSIS — R7303 Prediabetes: Secondary | ICD-10-CM | POA: Diagnosis not present

## 2023-09-05 DIAGNOSIS — M170 Bilateral primary osteoarthritis of knee: Secondary | ICD-10-CM | POA: Diagnosis not present

## 2023-09-05 DIAGNOSIS — Z8616 Personal history of COVID-19: Secondary | ICD-10-CM | POA: Diagnosis not present

## 2023-09-05 DIAGNOSIS — Z87820 Personal history of traumatic brain injury: Secondary | ICD-10-CM | POA: Diagnosis not present

## 2023-09-05 DIAGNOSIS — N1831 Chronic kidney disease, stage 3a: Secondary | ICD-10-CM | POA: Diagnosis not present

## 2023-09-05 DIAGNOSIS — E782 Mixed hyperlipidemia: Secondary | ICD-10-CM | POA: Diagnosis not present

## 2023-09-05 DIAGNOSIS — E039 Hypothyroidism, unspecified: Secondary | ICD-10-CM | POA: Diagnosis not present

## 2023-09-08 DIAGNOSIS — Z8616 Personal history of COVID-19: Secondary | ICD-10-CM | POA: Diagnosis not present

## 2023-09-08 DIAGNOSIS — M109 Gout, unspecified: Secondary | ICD-10-CM | POA: Diagnosis not present

## 2023-09-08 DIAGNOSIS — E559 Vitamin D deficiency, unspecified: Secondary | ICD-10-CM | POA: Diagnosis not present

## 2023-09-08 DIAGNOSIS — Z87820 Personal history of traumatic brain injury: Secondary | ICD-10-CM | POA: Diagnosis not present

## 2023-09-08 DIAGNOSIS — N1831 Chronic kidney disease, stage 3a: Secondary | ICD-10-CM | POA: Diagnosis not present

## 2023-09-08 DIAGNOSIS — M170 Bilateral primary osteoarthritis of knee: Secondary | ICD-10-CM | POA: Diagnosis not present

## 2023-09-08 DIAGNOSIS — E039 Hypothyroidism, unspecified: Secondary | ICD-10-CM | POA: Diagnosis not present

## 2023-09-08 DIAGNOSIS — K5790 Diverticulosis of intestine, part unspecified, without perforation or abscess without bleeding: Secondary | ICD-10-CM | POA: Diagnosis not present

## 2023-09-08 DIAGNOSIS — Z860100 Personal history of colon polyps, unspecified: Secondary | ICD-10-CM | POA: Diagnosis not present

## 2023-09-08 DIAGNOSIS — M25462 Effusion, left knee: Secondary | ICD-10-CM | POA: Diagnosis not present

## 2023-09-08 DIAGNOSIS — R7303 Prediabetes: Secondary | ICD-10-CM | POA: Diagnosis not present

## 2023-09-08 DIAGNOSIS — Z8546 Personal history of malignant neoplasm of prostate: Secondary | ICD-10-CM | POA: Diagnosis not present

## 2023-09-08 DIAGNOSIS — I129 Hypertensive chronic kidney disease with stage 1 through stage 4 chronic kidney disease, or unspecified chronic kidney disease: Secondary | ICD-10-CM | POA: Diagnosis not present

## 2023-09-08 DIAGNOSIS — Z556 Problems related to health literacy: Secondary | ICD-10-CM | POA: Diagnosis not present

## 2023-09-08 DIAGNOSIS — E782 Mixed hyperlipidemia: Secondary | ICD-10-CM | POA: Diagnosis not present

## 2023-09-08 DIAGNOSIS — Z9181 History of falling: Secondary | ICD-10-CM | POA: Diagnosis not present

## 2023-09-12 DIAGNOSIS — Z8546 Personal history of malignant neoplasm of prostate: Secondary | ICD-10-CM | POA: Diagnosis not present

## 2023-09-12 DIAGNOSIS — M170 Bilateral primary osteoarthritis of knee: Secondary | ICD-10-CM | POA: Diagnosis not present

## 2023-09-12 DIAGNOSIS — E559 Vitamin D deficiency, unspecified: Secondary | ICD-10-CM | POA: Diagnosis not present

## 2023-09-12 DIAGNOSIS — E039 Hypothyroidism, unspecified: Secondary | ICD-10-CM | POA: Diagnosis not present

## 2023-09-12 DIAGNOSIS — I129 Hypertensive chronic kidney disease with stage 1 through stage 4 chronic kidney disease, or unspecified chronic kidney disease: Secondary | ICD-10-CM | POA: Diagnosis not present

## 2023-09-12 DIAGNOSIS — E782 Mixed hyperlipidemia: Secondary | ICD-10-CM | POA: Diagnosis not present

## 2023-09-12 DIAGNOSIS — M25462 Effusion, left knee: Secondary | ICD-10-CM | POA: Diagnosis not present

## 2023-09-12 DIAGNOSIS — Z87820 Personal history of traumatic brain injury: Secondary | ICD-10-CM | POA: Diagnosis not present

## 2023-09-12 DIAGNOSIS — R7303 Prediabetes: Secondary | ICD-10-CM | POA: Diagnosis not present

## 2023-09-12 DIAGNOSIS — N1831 Chronic kidney disease, stage 3a: Secondary | ICD-10-CM | POA: Diagnosis not present

## 2023-09-12 DIAGNOSIS — K5790 Diverticulosis of intestine, part unspecified, without perforation or abscess without bleeding: Secondary | ICD-10-CM | POA: Diagnosis not present

## 2023-09-12 DIAGNOSIS — Z8616 Personal history of COVID-19: Secondary | ICD-10-CM | POA: Diagnosis not present

## 2023-09-12 DIAGNOSIS — Z9181 History of falling: Secondary | ICD-10-CM | POA: Diagnosis not present

## 2023-09-12 DIAGNOSIS — Z860100 Personal history of colon polyps, unspecified: Secondary | ICD-10-CM | POA: Diagnosis not present

## 2023-09-12 DIAGNOSIS — Z556 Problems related to health literacy: Secondary | ICD-10-CM | POA: Diagnosis not present

## 2023-09-12 DIAGNOSIS — M109 Gout, unspecified: Secondary | ICD-10-CM | POA: Diagnosis not present

## 2023-09-16 DIAGNOSIS — R9721 Rising PSA following treatment for malignant neoplasm of prostate: Secondary | ICD-10-CM | POA: Diagnosis not present

## 2023-09-19 DIAGNOSIS — N1831 Chronic kidney disease, stage 3a: Secondary | ICD-10-CM | POA: Diagnosis not present

## 2023-09-19 DIAGNOSIS — I129 Hypertensive chronic kidney disease with stage 1 through stage 4 chronic kidney disease, or unspecified chronic kidney disease: Secondary | ICD-10-CM | POA: Diagnosis not present

## 2023-09-19 DIAGNOSIS — E039 Hypothyroidism, unspecified: Secondary | ICD-10-CM | POA: Diagnosis not present

## 2023-09-19 DIAGNOSIS — Z556 Problems related to health literacy: Secondary | ICD-10-CM | POA: Diagnosis not present

## 2023-09-19 DIAGNOSIS — Z8616 Personal history of COVID-19: Secondary | ICD-10-CM | POA: Diagnosis not present

## 2023-09-19 DIAGNOSIS — E559 Vitamin D deficiency, unspecified: Secondary | ICD-10-CM | POA: Diagnosis not present

## 2023-09-19 DIAGNOSIS — M25462 Effusion, left knee: Secondary | ICD-10-CM | POA: Diagnosis not present

## 2023-09-19 DIAGNOSIS — M170 Bilateral primary osteoarthritis of knee: Secondary | ICD-10-CM | POA: Diagnosis not present

## 2023-09-19 DIAGNOSIS — E782 Mixed hyperlipidemia: Secondary | ICD-10-CM | POA: Diagnosis not present

## 2023-09-19 DIAGNOSIS — R7303 Prediabetes: Secondary | ICD-10-CM | POA: Diagnosis not present

## 2023-09-19 DIAGNOSIS — Z8546 Personal history of malignant neoplasm of prostate: Secondary | ICD-10-CM | POA: Diagnosis not present

## 2023-09-19 DIAGNOSIS — M109 Gout, unspecified: Secondary | ICD-10-CM | POA: Diagnosis not present

## 2023-09-19 DIAGNOSIS — Z87820 Personal history of traumatic brain injury: Secondary | ICD-10-CM | POA: Diagnosis not present

## 2023-09-19 DIAGNOSIS — Z860100 Personal history of colon polyps, unspecified: Secondary | ICD-10-CM | POA: Diagnosis not present

## 2023-09-19 DIAGNOSIS — K5790 Diverticulosis of intestine, part unspecified, without perforation or abscess without bleeding: Secondary | ICD-10-CM | POA: Diagnosis not present

## 2023-09-19 DIAGNOSIS — Z9181 History of falling: Secondary | ICD-10-CM | POA: Diagnosis not present

## 2023-09-23 DIAGNOSIS — R350 Frequency of micturition: Secondary | ICD-10-CM | POA: Diagnosis not present

## 2023-09-23 DIAGNOSIS — C61 Malignant neoplasm of prostate: Secondary | ICD-10-CM | POA: Diagnosis not present

## 2023-09-24 DIAGNOSIS — E039 Hypothyroidism, unspecified: Secondary | ICD-10-CM | POA: Diagnosis not present

## 2023-09-24 DIAGNOSIS — E782 Mixed hyperlipidemia: Secondary | ICD-10-CM | POA: Diagnosis not present

## 2023-09-24 DIAGNOSIS — M170 Bilateral primary osteoarthritis of knee: Secondary | ICD-10-CM | POA: Diagnosis not present

## 2023-09-24 DIAGNOSIS — Z8616 Personal history of COVID-19: Secondary | ICD-10-CM | POA: Diagnosis not present

## 2023-09-24 DIAGNOSIS — Z9181 History of falling: Secondary | ICD-10-CM | POA: Diagnosis not present

## 2023-09-24 DIAGNOSIS — M109 Gout, unspecified: Secondary | ICD-10-CM | POA: Diagnosis not present

## 2023-09-24 DIAGNOSIS — Z556 Problems related to health literacy: Secondary | ICD-10-CM | POA: Diagnosis not present

## 2023-09-24 DIAGNOSIS — I129 Hypertensive chronic kidney disease with stage 1 through stage 4 chronic kidney disease, or unspecified chronic kidney disease: Secondary | ICD-10-CM | POA: Diagnosis not present

## 2023-09-24 DIAGNOSIS — N1831 Chronic kidney disease, stage 3a: Secondary | ICD-10-CM | POA: Diagnosis not present

## 2023-09-24 DIAGNOSIS — E559 Vitamin D deficiency, unspecified: Secondary | ICD-10-CM | POA: Diagnosis not present

## 2023-09-24 DIAGNOSIS — M25462 Effusion, left knee: Secondary | ICD-10-CM | POA: Diagnosis not present

## 2023-09-24 DIAGNOSIS — Z8546 Personal history of malignant neoplasm of prostate: Secondary | ICD-10-CM | POA: Diagnosis not present

## 2023-09-24 DIAGNOSIS — Z860100 Personal history of colon polyps, unspecified: Secondary | ICD-10-CM | POA: Diagnosis not present

## 2023-09-24 DIAGNOSIS — K5790 Diverticulosis of intestine, part unspecified, without perforation or abscess without bleeding: Secondary | ICD-10-CM | POA: Diagnosis not present

## 2023-09-24 DIAGNOSIS — R7303 Prediabetes: Secondary | ICD-10-CM | POA: Diagnosis not present

## 2023-09-24 DIAGNOSIS — Z87820 Personal history of traumatic brain injury: Secondary | ICD-10-CM | POA: Diagnosis not present

## 2023-09-25 DIAGNOSIS — E782 Mixed hyperlipidemia: Secondary | ICD-10-CM | POA: Diagnosis not present

## 2023-09-25 DIAGNOSIS — I129 Hypertensive chronic kidney disease with stage 1 through stage 4 chronic kidney disease, or unspecified chronic kidney disease: Secondary | ICD-10-CM | POA: Diagnosis not present

## 2023-09-25 DIAGNOSIS — M170 Bilateral primary osteoarthritis of knee: Secondary | ICD-10-CM | POA: Diagnosis not present

## 2023-09-25 DIAGNOSIS — Z9181 History of falling: Secondary | ICD-10-CM | POA: Diagnosis not present

## 2023-09-25 DIAGNOSIS — Z8546 Personal history of malignant neoplasm of prostate: Secondary | ICD-10-CM | POA: Diagnosis not present

## 2023-09-25 DIAGNOSIS — M25462 Effusion, left knee: Secondary | ICD-10-CM | POA: Diagnosis not present

## 2023-09-25 DIAGNOSIS — K5790 Diverticulosis of intestine, part unspecified, without perforation or abscess without bleeding: Secondary | ICD-10-CM | POA: Diagnosis not present

## 2023-09-25 DIAGNOSIS — Z8616 Personal history of COVID-19: Secondary | ICD-10-CM | POA: Diagnosis not present

## 2023-09-25 DIAGNOSIS — R7303 Prediabetes: Secondary | ICD-10-CM | POA: Diagnosis not present

## 2023-09-25 DIAGNOSIS — Z860100 Personal history of colon polyps, unspecified: Secondary | ICD-10-CM | POA: Diagnosis not present

## 2023-09-25 DIAGNOSIS — E039 Hypothyroidism, unspecified: Secondary | ICD-10-CM | POA: Diagnosis not present

## 2023-09-25 DIAGNOSIS — Z87820 Personal history of traumatic brain injury: Secondary | ICD-10-CM | POA: Diagnosis not present

## 2023-09-25 DIAGNOSIS — M109 Gout, unspecified: Secondary | ICD-10-CM | POA: Diagnosis not present

## 2023-09-25 DIAGNOSIS — Z556 Problems related to health literacy: Secondary | ICD-10-CM | POA: Diagnosis not present

## 2023-09-25 DIAGNOSIS — E559 Vitamin D deficiency, unspecified: Secondary | ICD-10-CM | POA: Diagnosis not present

## 2023-09-25 DIAGNOSIS — N1831 Chronic kidney disease, stage 3a: Secondary | ICD-10-CM | POA: Diagnosis not present

## 2023-09-30 DIAGNOSIS — K5730 Diverticulosis of large intestine without perforation or abscess without bleeding: Secondary | ICD-10-CM | POA: Diagnosis not present

## 2023-09-30 DIAGNOSIS — R030 Elevated blood-pressure reading, without diagnosis of hypertension: Secondary | ICD-10-CM | POA: Diagnosis not present

## 2023-09-30 DIAGNOSIS — Z6827 Body mass index (BMI) 27.0-27.9, adult: Secondary | ICD-10-CM | POA: Diagnosis not present

## 2023-09-30 DIAGNOSIS — J209 Acute bronchitis, unspecified: Secondary | ICD-10-CM | POA: Diagnosis not present

## 2023-09-30 DIAGNOSIS — N429 Disorder of prostate, unspecified: Secondary | ICD-10-CM | POA: Diagnosis not present

## 2023-09-30 DIAGNOSIS — N182 Chronic kidney disease, stage 2 (mild): Secondary | ICD-10-CM | POA: Diagnosis not present

## 2023-10-01 DIAGNOSIS — E559 Vitamin D deficiency, unspecified: Secondary | ICD-10-CM | POA: Diagnosis not present

## 2023-10-01 DIAGNOSIS — N1831 Chronic kidney disease, stage 3a: Secondary | ICD-10-CM | POA: Diagnosis not present

## 2023-10-01 DIAGNOSIS — Z8546 Personal history of malignant neoplasm of prostate: Secondary | ICD-10-CM | POA: Diagnosis not present

## 2023-10-01 DIAGNOSIS — Z556 Problems related to health literacy: Secondary | ICD-10-CM | POA: Diagnosis not present

## 2023-10-01 DIAGNOSIS — R7303 Prediabetes: Secondary | ICD-10-CM | POA: Diagnosis not present

## 2023-10-01 DIAGNOSIS — E782 Mixed hyperlipidemia: Secondary | ICD-10-CM | POA: Diagnosis not present

## 2023-10-01 DIAGNOSIS — Z9181 History of falling: Secondary | ICD-10-CM | POA: Diagnosis not present

## 2023-10-01 DIAGNOSIS — Z860100 Personal history of colon polyps, unspecified: Secondary | ICD-10-CM | POA: Diagnosis not present

## 2023-10-01 DIAGNOSIS — Z87820 Personal history of traumatic brain injury: Secondary | ICD-10-CM | POA: Diagnosis not present

## 2023-10-01 DIAGNOSIS — M170 Bilateral primary osteoarthritis of knee: Secondary | ICD-10-CM | POA: Diagnosis not present

## 2023-10-01 DIAGNOSIS — M109 Gout, unspecified: Secondary | ICD-10-CM | POA: Diagnosis not present

## 2023-10-01 DIAGNOSIS — E039 Hypothyroidism, unspecified: Secondary | ICD-10-CM | POA: Diagnosis not present

## 2023-10-01 DIAGNOSIS — I129 Hypertensive chronic kidney disease with stage 1 through stage 4 chronic kidney disease, or unspecified chronic kidney disease: Secondary | ICD-10-CM | POA: Diagnosis not present

## 2023-10-01 DIAGNOSIS — K5790 Diverticulosis of intestine, part unspecified, without perforation or abscess without bleeding: Secondary | ICD-10-CM | POA: Diagnosis not present

## 2023-10-01 DIAGNOSIS — Z8616 Personal history of COVID-19: Secondary | ICD-10-CM | POA: Diagnosis not present

## 2023-10-01 DIAGNOSIS — M25462 Effusion, left knee: Secondary | ICD-10-CM | POA: Diagnosis not present

## 2023-10-10 DIAGNOSIS — I129 Hypertensive chronic kidney disease with stage 1 through stage 4 chronic kidney disease, or unspecified chronic kidney disease: Secondary | ICD-10-CM | POA: Diagnosis not present

## 2023-10-10 DIAGNOSIS — Z860100 Personal history of colon polyps, unspecified: Secondary | ICD-10-CM | POA: Diagnosis not present

## 2023-10-10 DIAGNOSIS — E782 Mixed hyperlipidemia: Secondary | ICD-10-CM | POA: Diagnosis not present

## 2023-10-10 DIAGNOSIS — Z8546 Personal history of malignant neoplasm of prostate: Secondary | ICD-10-CM | POA: Diagnosis not present

## 2023-10-10 DIAGNOSIS — M25462 Effusion, left knee: Secondary | ICD-10-CM | POA: Diagnosis not present

## 2023-10-10 DIAGNOSIS — M170 Bilateral primary osteoarthritis of knee: Secondary | ICD-10-CM | POA: Diagnosis not present

## 2023-10-10 DIAGNOSIS — Z8616 Personal history of COVID-19: Secondary | ICD-10-CM | POA: Diagnosis not present

## 2023-10-10 DIAGNOSIS — Z556 Problems related to health literacy: Secondary | ICD-10-CM | POA: Diagnosis not present

## 2023-10-10 DIAGNOSIS — K5790 Diverticulosis of intestine, part unspecified, without perforation or abscess without bleeding: Secondary | ICD-10-CM | POA: Diagnosis not present

## 2023-10-10 DIAGNOSIS — R7303 Prediabetes: Secondary | ICD-10-CM | POA: Diagnosis not present

## 2023-10-10 DIAGNOSIS — Z87820 Personal history of traumatic brain injury: Secondary | ICD-10-CM | POA: Diagnosis not present

## 2023-10-10 DIAGNOSIS — M109 Gout, unspecified: Secondary | ICD-10-CM | POA: Diagnosis not present

## 2023-10-10 DIAGNOSIS — E559 Vitamin D deficiency, unspecified: Secondary | ICD-10-CM | POA: Diagnosis not present

## 2023-10-10 DIAGNOSIS — N1831 Chronic kidney disease, stage 3a: Secondary | ICD-10-CM | POA: Diagnosis not present

## 2023-10-10 DIAGNOSIS — Z9181 History of falling: Secondary | ICD-10-CM | POA: Diagnosis not present

## 2023-10-10 DIAGNOSIS — E039 Hypothyroidism, unspecified: Secondary | ICD-10-CM | POA: Diagnosis not present

## 2023-10-13 DIAGNOSIS — K080 Exfoliation of teeth due to systemic causes: Secondary | ICD-10-CM | POA: Diagnosis not present

## 2023-10-17 DIAGNOSIS — M25462 Effusion, left knee: Secondary | ICD-10-CM | POA: Diagnosis not present

## 2023-10-17 DIAGNOSIS — Z87820 Personal history of traumatic brain injury: Secondary | ICD-10-CM | POA: Diagnosis not present

## 2023-10-17 DIAGNOSIS — Z8546 Personal history of malignant neoplasm of prostate: Secondary | ICD-10-CM | POA: Diagnosis not present

## 2023-10-17 DIAGNOSIS — R7303 Prediabetes: Secondary | ICD-10-CM | POA: Diagnosis not present

## 2023-10-17 DIAGNOSIS — Z9181 History of falling: Secondary | ICD-10-CM | POA: Diagnosis not present

## 2023-10-17 DIAGNOSIS — E559 Vitamin D deficiency, unspecified: Secondary | ICD-10-CM | POA: Diagnosis not present

## 2023-10-17 DIAGNOSIS — N1831 Chronic kidney disease, stage 3a: Secondary | ICD-10-CM | POA: Diagnosis not present

## 2023-10-17 DIAGNOSIS — Z860100 Personal history of colon polyps, unspecified: Secondary | ICD-10-CM | POA: Diagnosis not present

## 2023-10-17 DIAGNOSIS — Z8616 Personal history of COVID-19: Secondary | ICD-10-CM | POA: Diagnosis not present

## 2023-10-17 DIAGNOSIS — M109 Gout, unspecified: Secondary | ICD-10-CM | POA: Diagnosis not present

## 2023-10-17 DIAGNOSIS — K5790 Diverticulosis of intestine, part unspecified, without perforation or abscess without bleeding: Secondary | ICD-10-CM | POA: Diagnosis not present

## 2023-10-17 DIAGNOSIS — Z556 Problems related to health literacy: Secondary | ICD-10-CM | POA: Diagnosis not present

## 2023-10-17 DIAGNOSIS — E782 Mixed hyperlipidemia: Secondary | ICD-10-CM | POA: Diagnosis not present

## 2023-10-17 DIAGNOSIS — I129 Hypertensive chronic kidney disease with stage 1 through stage 4 chronic kidney disease, or unspecified chronic kidney disease: Secondary | ICD-10-CM | POA: Diagnosis not present

## 2023-10-17 DIAGNOSIS — E039 Hypothyroidism, unspecified: Secondary | ICD-10-CM | POA: Diagnosis not present

## 2023-10-17 DIAGNOSIS — M170 Bilateral primary osteoarthritis of knee: Secondary | ICD-10-CM | POA: Diagnosis not present

## 2023-10-22 DIAGNOSIS — Z8616 Personal history of COVID-19: Secondary | ICD-10-CM | POA: Diagnosis not present

## 2023-10-22 DIAGNOSIS — Z860100 Personal history of colon polyps, unspecified: Secondary | ICD-10-CM | POA: Diagnosis not present

## 2023-10-22 DIAGNOSIS — E559 Vitamin D deficiency, unspecified: Secondary | ICD-10-CM | POA: Diagnosis not present

## 2023-10-22 DIAGNOSIS — N1831 Chronic kidney disease, stage 3a: Secondary | ICD-10-CM | POA: Diagnosis not present

## 2023-10-22 DIAGNOSIS — Z9181 History of falling: Secondary | ICD-10-CM | POA: Diagnosis not present

## 2023-10-22 DIAGNOSIS — M170 Bilateral primary osteoarthritis of knee: Secondary | ICD-10-CM | POA: Diagnosis not present

## 2023-10-22 DIAGNOSIS — E782 Mixed hyperlipidemia: Secondary | ICD-10-CM | POA: Diagnosis not present

## 2023-10-22 DIAGNOSIS — K5790 Diverticulosis of intestine, part unspecified, without perforation or abscess without bleeding: Secondary | ICD-10-CM | POA: Diagnosis not present

## 2023-10-22 DIAGNOSIS — M109 Gout, unspecified: Secondary | ICD-10-CM | POA: Diagnosis not present

## 2023-10-22 DIAGNOSIS — Z8546 Personal history of malignant neoplasm of prostate: Secondary | ICD-10-CM | POA: Diagnosis not present

## 2023-10-22 DIAGNOSIS — E039 Hypothyroidism, unspecified: Secondary | ICD-10-CM | POA: Diagnosis not present

## 2023-10-22 DIAGNOSIS — M25462 Effusion, left knee: Secondary | ICD-10-CM | POA: Diagnosis not present

## 2023-10-22 DIAGNOSIS — Z556 Problems related to health literacy: Secondary | ICD-10-CM | POA: Diagnosis not present

## 2023-10-22 DIAGNOSIS — Z87820 Personal history of traumatic brain injury: Secondary | ICD-10-CM | POA: Diagnosis not present

## 2023-10-22 DIAGNOSIS — I129 Hypertensive chronic kidney disease with stage 1 through stage 4 chronic kidney disease, or unspecified chronic kidney disease: Secondary | ICD-10-CM | POA: Diagnosis not present

## 2023-10-22 DIAGNOSIS — R7303 Prediabetes: Secondary | ICD-10-CM | POA: Diagnosis not present

## 2023-10-29 DIAGNOSIS — M79671 Pain in right foot: Secondary | ICD-10-CM | POA: Diagnosis not present

## 2023-10-29 DIAGNOSIS — L609 Nail disorder, unspecified: Secondary | ICD-10-CM | POA: Diagnosis not present

## 2023-10-29 DIAGNOSIS — M79672 Pain in left foot: Secondary | ICD-10-CM | POA: Diagnosis not present

## 2023-10-29 DIAGNOSIS — I739 Peripheral vascular disease, unspecified: Secondary | ICD-10-CM | POA: Diagnosis not present

## 2023-11-18 DIAGNOSIS — I10 Essential (primary) hypertension: Secondary | ICD-10-CM | POA: Diagnosis not present

## 2023-11-18 DIAGNOSIS — E039 Hypothyroidism, unspecified: Secondary | ICD-10-CM | POA: Diagnosis not present

## 2023-11-18 DIAGNOSIS — Z6826 Body mass index (BMI) 26.0-26.9, adult: Secondary | ICD-10-CM | POA: Diagnosis not present

## 2023-11-18 DIAGNOSIS — N1831 Chronic kidney disease, stage 3a: Secondary | ICD-10-CM | POA: Diagnosis not present

## 2023-11-18 DIAGNOSIS — R351 Nocturia: Secondary | ICD-10-CM | POA: Diagnosis not present

## 2023-11-18 DIAGNOSIS — C61 Malignant neoplasm of prostate: Secondary | ICD-10-CM | POA: Diagnosis not present

## 2023-11-23 ENCOUNTER — Other Ambulatory Visit: Payer: Self-pay | Admitting: Internal Medicine

## 2023-11-24 NOTE — Telephone Encounter (Signed)
 Sending in limited refills. Needs OV.

## 2023-12-02 DIAGNOSIS — R296 Repeated falls: Secondary | ICD-10-CM | POA: Diagnosis not present

## 2023-12-02 DIAGNOSIS — N1831 Chronic kidney disease, stage 3a: Secondary | ICD-10-CM | POA: Diagnosis not present

## 2023-12-02 DIAGNOSIS — Z6826 Body mass index (BMI) 26.0-26.9, adult: Secondary | ICD-10-CM | POA: Diagnosis not present

## 2023-12-02 DIAGNOSIS — M25462 Effusion, left knee: Secondary | ICD-10-CM | POA: Diagnosis not present

## 2023-12-02 DIAGNOSIS — M1712 Unilateral primary osteoarthritis, left knee: Secondary | ICD-10-CM | POA: Diagnosis not present

## 2023-12-11 NOTE — Progress Notes (Unsigned)
 Referring Provider: Juliette Alcide, MD Primary Care Physician:  Juliette Alcide, MD Primary GI Physician: Dr. Jena Gauss  No chief complaint on file.   HPI:   Grant Martinez is a 89 y.o. male with history of gout, HLD, HTN, hypothyroidism, prostate cancer, colon polyps in the distant past per patient, dysphagia in the setting of peptic stricture, H pylori in 2024, presenting today for follow-up***   Today:       EGD 12/11/22:  - Benign- appearing esophageal stenosis. Dilated. Inflamed appearing gastric mucosa of uncertain significance? status post biopsy - Normal duodenal bulb and second portion of the duodenum. Pathology with H pylori.  Recommended treatment with Pylera x 14 days with protonix BID.     Past Medical History:  Diagnosis Date   Gout    Hyperlipemia    Hypertension    Prostate cancer [facility])    Thyroid disease     Past Surgical History:  Procedure Laterality Date   BIOPSY  12/11/2022   Procedure: BIOPSY;  Surgeon: Corbin Ade, MD;  Location: [facility] ENDO SUITE;  Service: Endoscopy;;   COLONOSCOPY     ESOPHAGOGASTRODUODENOSCOPY     ESOPHAGOGASTRODUODENOSCOPY (EGD) WITH PROPOFOL N/A 12/11/2022   Procedure: ESOPHAGOGASTRODUODENOSCOPY (EGD) WITH PROPOFOL;  Surgeon: Corbin Ade, MD;  Location: [facility] SUITE;  Service: Endoscopy;  Laterality: N/A;  12:15 pm, asa 3   INGUINAL HERNIA REPAIR Right 12/23/2022   Procedure: HERNIA REPAIR INGUINAL ADULT W/ MESH;  Surgeon: [name], MD;  Location: [facility] ORS;  Service: General;  Laterality: Right;  spoke with pt's niece, he is not a morning person but will do their best to be there by 7:30 - 7:45   MALONEY DILATION N/A 12/11/2022   Procedure: Elease Hashimoto DILATION;  Surgeon: Corbin Ade, MD;  Location: [facility];  Service: Endoscopy;  Laterality: N/A;    Current Outpatient Medications  Medication Sig Dispense Refill   Bismuth/Metronidaz/Tetracyclin (PYLERA) 140-125-125 MG CAPS Take 3 capsules by mouth 4  (four) times daily for 10 days. 120 capsule 0   Boswellia-Glucosamine-Vit D (OSTEO BI-FLEX ONE PER DAY PO) Take 1 tablet by mouth daily.     Cholecalciferol (VITAMIN D3) 125 MCG (5000 UT) CAPS Take 1 capsule by mouth daily.     colchicine 0.6 MG tablet Take 0.6 mg by mouth daily as needed (gout flare-ups).     diclofenac Sodium (VOLTAREN) 1 % GEL Apply 2 g topically 4 (four) times daily.     Leuprolide Acetate, 3 Month, (ELIGARD) 22.5 MG injection Inject 22.5 mg into the skin every 3 (three) months.     levothyroxine (SYNTHROID) 50 MCG tablet Take 50 mcg by mouth daily.     Multiple Vitamins-Minerals (CENTRUM ADULT 50+ MULTIGUMMIES) CHEW Chew by mouth.     ondansetron (ZOFRAN) 4 MG tablet Take 1 tablet (4 mg total) by mouth every 8 (eight) hours as needed. 30 tablet 1   OVER THE COUNTER MEDICATION Blue emu     oxyCODONE (ROXICODONE) 5 MG immediate release tablet Take 1 tablet (5 mg total) by mouth every 4 (four) hours as needed for severe pain or breakthrough pain. 20 tablet 0   pantoprazole (PROTONIX) 40 MG tablet Take 1 tablet by mouth once daily 60 tablet 0   pravastatin (PRAVACHOL) 20 MG tablet Take 20 mg by mouth daily.     No current facility-administered medications for this visit.    Allergies as of 12/12/2023   (No Known Allergies)  No family history on file.  Social History   Socioeconomic History   Marital status: Single    Spouse name: Not on file   Number of children: Not on file   Years of education: Not on file   Highest education level: Not on file  Occupational History   Not on file  Tobacco Use   Smoking status: Never   Smokeless tobacco: Never  Vaping Use   Vaping status: Never Used  Substance and Sexual Activity   Alcohol use: No   Drug use: Never   Sexual activity: Not on file  Other Topics Concern   Not on file  Social History Narrative   Not on file   Social Drivers of Health   Financial Resource Strain: Low Risk  (10/08/2022)   Received from  [facility], [facility]   Overall Financial Resource Strain (CARDIA)    Difficulty of Paying Living Expenses: Not hard at all  Food Insecurity: No Food Insecurity (10/08/2022)   Received from [facility], [facility]   Hunger Vital Sign    Worried About Running Out of Food in the Last Year: Never true    Ran Out of Food in the Last Year: Never true  Transportation Needs: No Transportation Needs (10/08/2022)   Received from [facility], [facility]   [facility] - Transportation    Lack of Transportation (Medical): No    Lack of Transportation (Non-Medical): No  Physical Activity: Inactive (10/08/2022)   Received from [facility], [facility]   Exercise Vital Sign    Days of Exercise per Week: 0 days    Minutes of Exercise per Session: 0 min  Stress: No Stress Concern Present (10/08/2022)   Received from [facility], [facility] of Occupational [facility] - Occupational Stress Questionnaire    Feeling of Stress : Not at all  Social Connections: Socially Isolated (10/08/2022)   Received from [facility], [facility]   Social Connection and Isolation Panel [NHANES]    Frequency of Communication with Friends and Family: More than three times a week    Frequency of Social Gatherings with Friends and Family: More than three times a week    Attends Religious Services: Never    Database administrator or Organizations: No    Attends Banker Meetings: Never    Marital Status: Never married    Review of Systems: Gen: Denies fever, chills, anorexia. Denies fatigue, weakness, weight loss.  CV: Denies chest pain, palpitations, syncope, peripheral edema, and claudication. Resp: Denies dyspnea at rest, cough, wheezing, coughing up blood, and pleurisy. GI: Denies vomiting blood, jaundice, and fecal incontinence.   Denies dysphagia or odynophagia. Derm: Denies rash, itching, dry skin Psych: Denies depression, anxiety, memory  loss, confusion. No homicidal or suicidal ideation.  Heme: Denies bruising, bleeding, and enlarged lymph nodes.  Physical Exam: There were no vitals taken for this visit. General:   Alert and oriented. No distress noted. Pleasant and cooperative.  Head:  Normocephalic and atraumatic. Eyes:  Conjuctiva clear without scleral icterus. Heart:  S1, S2 present without murmurs appreciated. Lungs:  Clear to auscultation bilaterally. No wheezes, rales, or rhonchi. No distress.  Abdomen:  +BS, soft, non-tender and non-distended. No rebound or guarding. No HSM or masses noted. Msk:  Symmetrical without gross deformities. Normal posture. Extremities:  Without edema. Neurologic:  Alert and  oriented x4 Psych:  Normal mood and affect.    Assessment:     Plan:  ***  Ermalinda Memos, PA-C [facility] 12/12/2023

## 2023-12-12 ENCOUNTER — Ambulatory Visit: Admitting: Gastroenterology

## 2023-12-12 ENCOUNTER — Encounter: Payer: Self-pay | Admitting: Gastroenterology

## 2023-12-12 VITALS — BP 138/84 | HR 69 | Temp 97.7°F | Ht 68.0 in | Wt 180.0 lb

## 2023-12-12 DIAGNOSIS — R1310 Dysphagia, unspecified: Secondary | ICD-10-CM | POA: Diagnosis not present

## 2023-12-12 DIAGNOSIS — R197 Diarrhea, unspecified: Secondary | ICD-10-CM | POA: Diagnosis not present

## 2023-12-12 DIAGNOSIS — R059 Cough, unspecified: Secondary | ICD-10-CM

## 2023-12-12 DIAGNOSIS — K5900 Constipation, unspecified: Secondary | ICD-10-CM | POA: Diagnosis not present

## 2023-12-12 DIAGNOSIS — R198 Other specified symptoms and signs involving the digestive system and abdomen: Secondary | ICD-10-CM

## 2023-12-12 DIAGNOSIS — Z8619 Personal history of other infectious and parasitic diseases: Secondary | ICD-10-CM

## 2023-12-12 NOTE — Patient Instructions (Signed)
 We will get you scheduled for a swallow study at [facility].  Swallowing precautions:  Eat slowly, take small bites, chew thoroughly, drink plenty of liquids throughout meals.  Avoid trough textures All meats should be chopped finely.  If something gets hung in your esophagus and will not come up or go down, proceed to the emergency room.    To help with your alternating constipation and diarrhea: Start Benefiber 3 teaspoons daily x 2 weeks, then increase to twice daily. After 4 weeks, if you are not having adequate bowel movements on a daily basis, I recommend that you start MiraLAX 1 capful (17 g) daily in 8 ounces of water.  It is possible that you will have some diarrhea initially with this, but anticipated it would taper off as your bowel cleanout and will normalize.  However, if you have ongoing diarrhea, you can decrease MiraLAX to one half capful daily or try every other day.  I will have further recommendations for you following your swallow study.  It was good to see you today!  Ermalinda Memos, PA-C [facility]

## 2023-12-15 DIAGNOSIS — R9721 Rising PSA following treatment for malignant neoplasm of prostate: Secondary | ICD-10-CM | POA: Diagnosis not present

## 2023-12-17 ENCOUNTER — Other Ambulatory Visit (HOSPITAL_COMMUNITY)

## 2023-12-19 ENCOUNTER — Ambulatory Visit (HOSPITAL_COMMUNITY)
Admission: RE | Admit: 2023-12-19 | Discharge: 2023-12-19 | Disposition: A | Discharge status: Home / Self Care | Source: Ambulatory Visit | Attending: Gastroenterology | Admitting: Gastroenterology

## 2023-12-19 DIAGNOSIS — R1310 Dysphagia, unspecified: Secondary | ICD-10-CM | POA: Diagnosis not present

## 2023-12-19 DIAGNOSIS — K2289 Other specified disease of esophagus: Secondary | ICD-10-CM | POA: Diagnosis not present

## 2023-12-19 DIAGNOSIS — K219 Gastro-esophageal reflux disease without esophagitis: Secondary | ICD-10-CM | POA: Diagnosis not present

## 2023-12-19 DIAGNOSIS — K224 Dyskinesia of esophagus: Secondary | ICD-10-CM | POA: Diagnosis not present

## 2023-12-22 ENCOUNTER — Other Ambulatory Visit: Payer: Self-pay | Admitting: *Deleted

## 2023-12-22 DIAGNOSIS — Z8619 Personal history of other infectious and parasitic diseases: Secondary | ICD-10-CM

## 2023-12-22 DIAGNOSIS — C61 Malignant neoplasm of prostate: Secondary | ICD-10-CM | POA: Diagnosis not present

## 2023-12-22 DIAGNOSIS — R350 Frequency of micturition: Secondary | ICD-10-CM | POA: Diagnosis not present

## 2023-12-22 DIAGNOSIS — R3915 Urgency of urination: Secondary | ICD-10-CM | POA: Diagnosis not present

## 2023-12-23 ENCOUNTER — Encounter: Payer: Self-pay | Admitting: Gastroenterology

## 2024-01-07 DIAGNOSIS — I739 Peripheral vascular disease, unspecified: Secondary | ICD-10-CM | POA: Diagnosis not present

## 2024-01-07 DIAGNOSIS — M79671 Pain in right foot: Secondary | ICD-10-CM | POA: Diagnosis not present

## 2024-01-07 DIAGNOSIS — M79672 Pain in left foot: Secondary | ICD-10-CM | POA: Diagnosis not present

## 2024-01-07 DIAGNOSIS — L609 Nail disorder, unspecified: Secondary | ICD-10-CM | POA: Diagnosis not present

## 2024-01-21 ENCOUNTER — Other Ambulatory Visit: Payer: Self-pay | Admitting: Gastroenterology

## 2024-02-02 NOTE — Progress Notes (Signed)
 Referring Provider: Alston Jerry, MD Primary Care Physician:  Alston Jerry, MD Primary GI Physician: Dr. Riley Cheadle  Chief Complaint  Patient presents with   Follow-up    Follow up. Didn't have stools studies done.     HPI:   Grant Martinez is a 89 y.o. male presenting today with a history of  gout, HLD, HTN, hypothyroidism, prostate cancer, reported colon polyps in the distant past, dysphagia in the setting of peptic stricture dilated in 2024, H. pylori in 2024, presenting today for follow-up of alternating constipation and diarrhea, dysphagia, history of H. Pylori.   EGD 12/11/22:  - Benign- appearing esophageal stenosis. Dilated. Inflamed appearing gastric mucosa of uncertain significance? status post biopsy - Normal duodenal bulb and second portion of the duodenum. Initially recommended Protonix  daily, but pathology with H pylori.  Recommended treatment with Pylera x 14 days with protonix  BID.   Last seen in the office 12/12/2023.  He reported recurrent dysphagia with intermittent regurgitation, but also with sensation of foods going down the wrong way or coughing.  Only had improvement for couple of months after dilation of peptic stricture in March 2024.  Query persistent/recurrent peptic stricture versus esophageal dysmotility versus oropharyngeal dysphagia.  Plan to complete barium pill esophagram.  Also needed testing to verify H. pylori eradication.  Would determine method pending barium pill esophagram result.  Regarding alternating constipation and diarrhea, suspected baseline constipation with overflow diarrhea and recommended starting Benefiber and if this was not enough, start MiraLAX  daily.  Barium pill esophagram 12/19/23 with esophageal dysmotility with tertiary contractions as can be seen with presbyesophagus, low volume GERD. Recommended H. pylori stool antigen, then start pantoprazole  daily.  H. pylori stool antigen was never  completed.    Today: Dysphagia/mild GERD: No GERD symptoms, taking pantoprazole  40 mg daily.  Coughing with peanutes only.  Feels like they get stuck in his esophagus that he has to vomit and back up.  No issues with eating fruits, vegetables, meats.  Niece states patient also coughs when drinking liquids.  They would like to have modified barium swallow.   History of H. pylori: Hasn't completed H. pylori stool antigen test.  Alternating constipation and diarrhea: Now only with constipation. Not taking benefiber.  Skipping 2-3 days between bowel movements. Having hard stools at times where he has to strain.  No brbpr or melena. Some abdominal pain at times related to constipation, but resolves with a bowel movement.  Has some excess gas.   Colonoscopy in [place] of years ago. Polyps in the past, but doesn't think he had any on the most recent.   Past Medical History:  Diagnosis Date   Gout    Hyperlipemia    Hypertension    Prostate cancer (HCC)    Thyroid  disease     Past Surgical History:  Procedure Laterality Date   BIOPSY  12/11/2022   Procedure: BIOPSY;  Surgeon: Suzette Espy, MD;  [facility];  Service: Endoscopy;;   COLONOSCOPY     ESOPHAGOGASTRODUODENOSCOPY     ESOPHAGOGASTRODUODENOSCOPY (EGD) WITH PROPOFOL  N/A 12/11/2022   Procedure: ESOPHAGOGASTRODUODENOSCOPY (EGD) WITH PROPOFOL ;  Surgeon: Suzette Espy, MD;  [facility];  Service: Endoscopy;  Laterality: N/A;  12:15 pm, asa 3   INGUINAL HERNIA REPAIR Right 12/23/2022   Procedure: HERNIA REPAIR INGUINAL ADULT W/ MESH;  Surgeon: Awilda Bogus, MD;  Location: [facility];  Service: General;  Laterality: Right;  spoke with pt's niece, he is not  a morning person but will do their best to be there by 7:30 - 7:45   MALONEY DILATION N/A 12/11/2022   Procedure: Londa Rival DILATION;  Surgeon: Suzette Espy, MD;  [facility];  Service: Endoscopy;  Laterality: N/A;    Current Outpatient  Medications  Medication Sig Dispense Refill   atorvastatin (LIPITOR) 10 MG tablet Take 10 mg by mouth daily.     Boswellia-Glucosamine-Vit D (OSTEO BI-FLEX ONE PER DAY PO) Take 1 tablet by mouth daily.     Cholecalciferol (VITAMIN D3) 125 MCG (5000 UT) CAPS Take 1 capsule by mouth daily.     colchicine 0.6 MG tablet Take 0.6 mg by mouth daily as needed (gout flare-ups).     diclofenac  Sodium (VOLTAREN ) 1 % GEL Apply 2 g topically 4 (four) times daily.     Leuprolide  Acetate, 3 Month, (ELIGARD ) 22.5 MG injection Inject 22.5 mg into the skin every 6 (six) months.     levothyroxine (SYNTHROID) 50 MCG tablet Take 50 mcg by mouth daily.     Multiple Vitamins-Minerals (CENTRUM ADULT 50+ MULTIGUMMIES) CHEW Chew by mouth.     OVER THE COUNTER MEDICATION Blue emu     pantoprazole  (PROTONIX ) 40 MG tablet Take 1 tablet by mouth once daily 30 tablet 3   pravastatin (PRAVACHOL) 20 MG tablet Take 20 mg by mouth daily.     tamsulosin (FLOMAX) 0.4 MG CAPS capsule Take 0.4 mg by mouth daily. (Patient not taking: Reported on 02/04/2024)     No current facility-administered medications for this visit.    Allergies as of 02/04/2024   (No Known Allergies)    History reviewed. No pertinent family history.  Social History   Socioeconomic History   Marital status: Single    Spouse name: Not on file   Number of children: Not on file   Years of education: Not on file   Highest education level: Not on file  Occupational History   Not on file  Tobacco Use   Smoking status: Never   Smokeless tobacco: Never  Vaping Use   Vaping status: Never Used  Substance and Sexual Activity   Alcohol use: No   Drug use: Never   Sexual activity: Not on file  Other Topics Concern   Not on file  Social History Narrative   Not on file   Social Drivers of Health   Financial Resource Strain: Low Risk  (10/08/2022)   Received from [facility], [facility]   Overall Financial Resource Strain (CARDIA)     Difficulty of Paying Living Expenses: Not hard at all  Food Insecurity: No Food Insecurity (10/08/2022)   Received from [facility], [facility]   Hunger Vital Sign    Worried About Running Out of Food in the Last Year: Never true    Ran Out of Food in the Last Year: Never true  Transportation Needs: No Transportation Needs (10/08/2022)   Received from [facility], [facility]   [facility] - Transportation    Lack of Transportation (Medical): No    Lack of Transportation (Non-Medical): No  Physical Activity: Inactive (10/08/2022)   Received from [facility], [facility]   Exercise Vital Sign    Days of Exercise per Week: 0 days    Minutes of Exercise per Session: 0 min  Stress: No Stress Concern Present (10/08/2022)   Received from [facility], [facility] - Occupational Stress Questionnaire    Feeling of Stress : Not  at all  Social Connections: Socially Isolated (10/08/2022)   Received from [facility], [facility]   Social Connection and Isolation Panel [NHANES]    Frequency of Communication with Friends and Family: More than three times a week    Frequency of Social Gatherings with Friends and Family: More than three times a week    Attends Religious Services: Never    Database administrator or Organizations: No    Attends Engineer, structural: Never    Marital Status: Never married    Review of Systems: Gen: Denies fever, chills, cold or flulike symptoms, presyncope, syncope. GI: See HPI Heme: See HPI  Physical Exam: BP 137/76 (BP Location: Right Arm, Patient Position: Sitting, Cuff Size: Large)   Pulse 67   Temp 97.7 F (36.5 C) (Temporal)   Ht 5\' 8"  (1.727 m)   Wt 182 lb 12.8 oz (82.9 kg)   BMI 27.79 kg/m  General:   Alert and oriented. No distress noted. Pleasant and cooperative.  Head:  Normocephalic and atraumatic. Eyes:  Conjuctiva clear without scleral icterus. Heart:  S1, S2  present without murmurs appreciated. Lungs:  Clear to auscultation bilaterally. No wheezes, rales, or rhonchi. No distress.  Abdomen:  +BS, soft, non-tender and non-distended. No rebound or guarding. No HSM or masses noted. Msk:  Symmetrical without gross deformities. Normal posture. Extremities:  Without edema. Neurologic:  Alert and  oriented x4 Psych:  Normal mood and affect.    Assessment:  Dysphagia: Likely multifactorial in the setting of esophageal dysmotility and possible oropharyngeal dysphagia as patient's niece also reports having a lot of coughing with meals.  Prior EGD in March 2024 with benign-appearing esophageal stenosis s/p dilation.  Barium pill esophagram 12/19/23 with esophageal dysmotility, tertiary contractions of send be seen with presbyesophagus. Will refer to SLP for modified barium swallow to evaluate for oropharyngeal dysphagia.  Constipation: Previously with alternating constipation and diarrhea, but now but now with constipation only.  Has not started Benefiber as previously recommended, but I do not think Benefiber alone will be enough at this point.  No alarm symptoms. Recommended starting MiraLAX  daily.  GERD: Doing well on pantoprazole  40 mg daily.  History of H. pylori: Diagnosed at the time of EGD 12/11/2022.  He was treated with 14-day course of Pylera.  Still needs to complete H. pylori stool antigen test to confirm eradication.   Plan:  Refer to SLP for modified barium swallow. Avoid peanuts as this seems to be the only food that is causing him symptoms related to esophageal dysmotility. Start MiraLAX  17 g daily. Hold Protonix  for 14 days, then complete H. pylori stool antigen test. After completing H. pylori stool test, can resume pantoprazole  daily. Follow-up in 3 months.   Shana Daring, PA-C [facility] 02/04/2024

## 2024-02-04 ENCOUNTER — Ambulatory Visit: Admitting: Gastroenterology

## 2024-02-04 ENCOUNTER — Other Ambulatory Visit (HOSPITAL_COMMUNITY): Payer: Self-pay | Admitting: Occupational Therapy

## 2024-02-04 ENCOUNTER — Other Ambulatory Visit: Payer: Self-pay | Admitting: *Deleted

## 2024-02-04 ENCOUNTER — Encounter: Payer: Self-pay | Admitting: Gastroenterology

## 2024-02-04 VITALS — BP 137/76 | HR 67 | Temp 97.7°F | Ht 68.0 in | Wt 182.8 lb

## 2024-02-04 DIAGNOSIS — R059 Cough, unspecified: Secondary | ICD-10-CM

## 2024-02-04 DIAGNOSIS — K219 Gastro-esophageal reflux disease without esophagitis: Secondary | ICD-10-CM

## 2024-02-04 DIAGNOSIS — Z8619 Personal history of other infectious and parasitic diseases: Secondary | ICD-10-CM | POA: Diagnosis not present

## 2024-02-04 DIAGNOSIS — R1310 Dysphagia, unspecified: Secondary | ICD-10-CM

## 2024-02-04 DIAGNOSIS — K5900 Constipation, unspecified: Secondary | ICD-10-CM

## 2024-02-04 DIAGNOSIS — R1312 Dysphagia, oropharyngeal phase: Secondary | ICD-10-CM

## 2024-02-04 NOTE — Patient Instructions (Addendum)
 We will get you referred to speech therapy for a special swallow study.   Avoid peanuts for now as this is the primary food that is causing issues with swallowing.   Hold pantoprazole  for 14 days, then complete H pylori stool test.   Start MiraLAX  17 g (1 capful) daily in 8 oz of water or other noncarbonated beverage of your choice.  As we discussed, you may have some diarrhea initially, but that should taper off once your bowels get cleaned out and hopeful that you will be left with having a good bowel movement every day.  I will see you back in 3 months or sooner if needed.   Shana Daring, PA-C [facility]

## 2024-02-13 DIAGNOSIS — E039 Hypothyroidism, unspecified: Secondary | ICD-10-CM | POA: Diagnosis not present

## 2024-02-13 DIAGNOSIS — I10 Essential (primary) hypertension: Secondary | ICD-10-CM | POA: Diagnosis not present

## 2024-02-13 DIAGNOSIS — R7989 Other specified abnormal findings of blood chemistry: Secondary | ICD-10-CM | POA: Diagnosis not present

## 2024-02-13 DIAGNOSIS — Z13220 Encounter for screening for lipoid disorders: Secondary | ICD-10-CM | POA: Diagnosis not present

## 2024-02-13 DIAGNOSIS — N1831 Chronic kidney disease, stage 3a: Secondary | ICD-10-CM | POA: Diagnosis not present

## 2024-02-16 ENCOUNTER — Ambulatory Visit (HOSPITAL_COMMUNITY)
Admission: RE | Admit: 2024-02-16 | Discharge: 2024-02-16 | Disposition: A | Discharge status: Home / Self Care | Source: Ambulatory Visit | Attending: Gastroenterology | Admitting: Gastroenterology

## 2024-02-16 ENCOUNTER — Ambulatory Visit (HOSPITAL_COMMUNITY): Attending: Gastroenterology | Admitting: Speech Pathology

## 2024-02-16 ENCOUNTER — Encounter (HOSPITAL_COMMUNITY): Payer: Self-pay | Admitting: Speech Pathology

## 2024-02-16 DIAGNOSIS — R1312 Dysphagia, oropharyngeal phase: Secondary | ICD-10-CM | POA: Diagnosis not present

## 2024-02-16 DIAGNOSIS — K219 Gastro-esophageal reflux disease without esophagitis: Secondary | ICD-10-CM | POA: Insufficient documentation

## 2024-02-16 DIAGNOSIS — R638 Other symptoms and signs concerning food and fluid intake: Secondary | ICD-10-CM | POA: Diagnosis not present

## 2024-02-16 DIAGNOSIS — R059 Cough, unspecified: Secondary | ICD-10-CM | POA: Insufficient documentation

## 2024-02-16 DIAGNOSIS — R1310 Dysphagia, unspecified: Secondary | ICD-10-CM | POA: Insufficient documentation

## 2024-02-16 NOTE — Therapy (Signed)
 Modified Barium Swallow Study  Patient Details  Name: Grant Martinez MRN: [id number] Date of Birth: 13-Jan-1934  Today's Date: 02/16/2024  HPI/PMH: HPI: Grant Martinez is an 89 yo male who was referred for MBSS by Shana Daring, PA-C. Pt reports difficulty swallowing meats and peanuts and some coughing with thin liquids. He also reports weakness on his left side (arm and leg) for a year plus. He denies bouts of PNA. He is accompanied to the MBSS by his niece.   K21.9 (ICD-10-CM) - Gastroesophageal reflux disease, unspecified whether esophagitis present R13.10 (ICD-10-CM) - Dysphagia, unspecified type R05.9 (ICD-10-CM) - Cough, unspecified type  Pt had Esophagram 12/19/2023: IMPRESSION: 1. Esophageal dysmotility with tertiary contractions, as can be seen with presbyesophagus. 2. Gastroesophageal reflux at the distal thoracic esophagus.  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 Pt with 3 episodes of flash penetration (tsp thin on the second/dry swallow of first bolus, cup NTL, and straw sips thin) without aspiration. Pt with mild to mod vallecular residue and mild pyriform residue after the primary swallow, which clears with repeat/dry swallows. Pt reports regurgitation at times of meats and peanuts. He was encouraged to alter the way he consumes both, either by adding moisture, finely chopping, adding nuts to a pudding/yogurt, taking small bites and swallowing 2-3x for each presentation. Recommend regular textures with altered as above as  needed and no restrictions on liquids via small bites/sips and swallow 2x for each bolus, Pt to clear throat and swallow when voice sounds wet. This exam and recommendations were reviewed with Pt and his niece. He was given written information in addition to my contact information if he has further questions.   Factors that may increase risk of adverse event in presence of aspiration Roderick Civatte & Jessy Morocco 2021): No data recorded  Recommendations/Plan: Swallowing Evaluation Recommendations Swallowing Evaluation Recommendations Recommendations: PO diet PO Diet Recommendation: Regular; Thin liquids (Level 0) Liquid Administration via: Cup; Straw Medication Administration: Whole meds with liquid Supervision: Patient able to self-feed Swallowing strategies  : Small bites/sips; effortful swallow; Multiple dry swallows after each bite/sip Postural changes: Position pt fully upright for meals; Stay upright 30-60 min after meals Oral care recommendations: Oral care BID (2x/day)    Treatment Plan Treatment Plan Treatment recommendations: No treatment recommended at this time Follow-up recommendations: No SLP follow up     Recommendations Recommendations for follow up therapy are one component of a multi-disciplinary discharge planning process, led by the attending physician.  Recommendations may be updated based on patient status, additional functional criteria and insurance authorization.  Assessment: Orofacial Exam: Orofacial Exam Oral Cavity: Oral Hygiene: WFL Oral Cavity - Dentition: Adequate natural dentition Orofacial Anatomy: WFL Oral Motor/Sensory Function: WFL    Anatomy:  Anatomy: WFL   Boluses Administered: Boluses Administered Boluses Administered: Thin liquids (Level 0); Mildly thick liquids (Level 2, nectar thick); Moderately thick liquids (Level 3, honey thick); Puree; Solid     Oral Impairment Domain: Oral Impairment Domain Lip Closure: No labial escape Tongue  control during bolus hold: Cohesive bolus between tongue to palatal seal Bolus preparation/mastication: Slow prolonged chewing/mashing with complete recollection Bolus transport/lingual motion: Delayed  initiation of tongue motion (oral holding) Oral residue: Trace residue lining oral structures Location of oral residue : Tongue Initiation of pharyngeal swallow : Posterior laryngeal surface of the epiglottis; Valleculae     Pharyngeal Impairment Domain: Pharyngeal Impairment Domain Soft palate elevation: No bolus between soft palate (SP)/pharyngeal wall (PW) Laryngeal elevation: Complete superior movement of thyroid  cartilage with complete approximation of arytenoids to epiglottic petiole Anterior hyoid excursion: Complete anterior movement Epiglottic movement: Complete inversion Laryngeal vestibule closure: Incomplete, narrow column air/contrast in laryngeal vestibule Pharyngeal stripping wave : Present - diminished Pharyngeal contraction (A/P view only): Complete Pharyngoesophageal segment opening: Complete distension and complete duration, no obstruction of flow Tongue base retraction: Wide column of contrast or air between tongue base and PPW Pharyngeal residue: Collection of residue within or on pharyngeal structures Location of pharyngeal residue: Valleculae; Tongue base; Diffuse (>3 areas); Pyriform sinuses     Esophageal Impairment Domain: Esophageal Impairment Domain Esophageal clearance upright position: Complete clearance, esophageal coating    Pill: Pill Consistency administered: Thin liquids (Level 0) Thin liquids (Level 0): [facility]    Penetration/Aspiration Scale Score: Penetration/Aspiration Scale Score 1.  Material does not enter airway: Moderately thick liquids (Level 3, honey thick); Puree; Solid; Pill 3.  Material enters airway, remains ABOVE vocal cords and not ejected out: Mildly thick liquids (Level 2, nectar thick) 4.  Material enters airway, CONTACTS cords  then ejected out: Thin liquids (Level 0)    Compensatory Strategies: Compensatory Strategies Compensatory strategies: Yes Effortful swallow: Effective Effective Effortful Swallow: Puree Multiple swallows: Effective Effective Multiple Swallows: Thin liquid (Level 0); Mildly thick liquid (Level 2, nectar thick); Puree; Solid Chin tuck: Ineffective Left head turn: Effective Effective Left Head Turn: Puree       General Information: Caregiver present: Yes   Diet Prior to this Study: Regular; Thin liquids (Level 0)    Temperature : Normal    Respiratory Status: WFL    Supplemental O2: None (Room air)    History of Recent Intubation: No   Behavior/Cognition: Alert; Cooperative; Pleasant mood  Self-Feeding Abilities: Able to self-feed  Baseline vocal quality/speech: Normal  Volitional Cough: Able to elicit  Volitional Swallow: Able to elicit  Exam Limitations: No limitations   Pain: Pain Assessment Pain Assessment: No/denies pain   End of Session: Start Time:No data recorded Stop Time: No data recorded Time Calculation:No data recorded Charges: No data recorded SLP visit diagnosis: SLP Visit Diagnosis: Dysphagia, oropharyngeal phase (R13.12)    Past Medical History:  Past Medical History:  Diagnosis Date   Gout    Hyperlipemia    Hypertension    Prostate cancer (HCC)    Thyroid  disease    Past Surgical History:  Past Surgical History:  Procedure Laterality Date   BIOPSY  12/11/2022   Procedure: BIOPSY;  Surgeon: Suzette Espy, MD;  [facility];  Service: Endoscopy;;   COLONOSCOPY     ESOPHAGOGASTRODUODENOSCOPY     ESOPHAGOGASTRODUODENOSCOPY (EGD) WITH PROPOFOL  N/A 12/11/2022   Procedure: ESOPHAGOGASTRODUODENOSCOPY (EGD) WITH PROPOFOL ;  Surgeon: Suzette Espy, MD;  [facility];  Service: Endoscopy;  Laterality: N/A;  12:15 pm, asa 3   INGUINAL HERNIA REPAIR Right 12/23/2022   Procedure: HERNIA REPAIR INGUINAL ADULT W/ MESH;   Surgeon: Awilda Bogus, MD;  Location: [facility];  Service: General;  Laterality: Right;  spoke with pt's niece, he is not a morning person but will do their best to be there by 7:30 - 7:45   MALONEY DILATION N/A 12/11/2022  Procedure: MALONEY DILATION;  Surgeon: Suzette Espy, MD;  [facility];  Service: Endoscopy;  Laterality: N/A;   Thank you,  Claudetta Cuba, CCC-SLP [phone]  Aminah Zabawa 02/16/2024, 4:11 PM

## 2024-02-19 ENCOUNTER — Other Ambulatory Visit (HOSPITAL_COMMUNITY)
Admission: RE | Admit: 2024-02-19 | Discharge: 2024-02-19 | Disposition: A | Discharge status: Home / Self Care | Source: Ambulatory Visit | Attending: Gastroenterology | Admitting: Gastroenterology

## 2024-02-19 DIAGNOSIS — Z8619 Personal history of other infectious and parasitic diseases: Secondary | ICD-10-CM | POA: Diagnosis not present

## 2024-02-20 DIAGNOSIS — I10 Essential (primary) hypertension: Secondary | ICD-10-CM | POA: Diagnosis not present

## 2024-02-20 DIAGNOSIS — Z0001 Encounter for general adult medical examination with abnormal findings: Secondary | ICD-10-CM | POA: Diagnosis not present

## 2024-02-20 DIAGNOSIS — Z9189 Other specified personal risk factors, not elsewhere classified: Secondary | ICD-10-CM | POA: Diagnosis not present

## 2024-02-20 DIAGNOSIS — Z6826 Body mass index (BMI) 26.0-26.9, adult: Secondary | ICD-10-CM | POA: Diagnosis not present

## 2024-02-20 DIAGNOSIS — K222 Esophageal obstruction: Secondary | ICD-10-CM | POA: Diagnosis not present

## 2024-02-20 DIAGNOSIS — Z1389 Encounter for screening for other disorder: Secondary | ICD-10-CM | POA: Diagnosis not present

## 2024-02-20 DIAGNOSIS — E039 Hypothyroidism, unspecified: Secondary | ICD-10-CM | POA: Diagnosis not present

## 2024-02-20 DIAGNOSIS — N1831 Chronic kidney disease, stage 3a: Secondary | ICD-10-CM | POA: Diagnosis not present

## 2024-02-20 DIAGNOSIS — Z1331 Encounter for screening for depression: Secondary | ICD-10-CM | POA: Diagnosis not present

## 2024-02-21 LAB — H. PYLORI ANTIGEN, STOOL: H. Pylori Stool Ag, Eia: NEGATIVE

## 2024-02-29 ENCOUNTER — Ambulatory Visit: Payer: Self-pay | Admitting: Gastroenterology

## 2024-03-31 DIAGNOSIS — L609 Nail disorder, unspecified: Secondary | ICD-10-CM | POA: Diagnosis not present

## 2024-03-31 DIAGNOSIS — I739 Peripheral vascular disease, unspecified: Secondary | ICD-10-CM | POA: Diagnosis not present

## 2024-03-31 DIAGNOSIS — M79672 Pain in left foot: Secondary | ICD-10-CM | POA: Diagnosis not present

## 2024-03-31 DIAGNOSIS — M79671 Pain in right foot: Secondary | ICD-10-CM | POA: Diagnosis not present

## 2024-04-13 DIAGNOSIS — K080 Exfoliation of teeth due to systemic causes: Secondary | ICD-10-CM | POA: Diagnosis not present

## 2024-05-03 NOTE — Progress Notes (Unsigned)
 Referring Provider: Lari Elspeth BRAVO, MD Primary Care Physician:  Lari Elspeth BRAVO, MD Primary GI Physician: Dr. Shaaron  No chief complaint on file.   HPI:   Grant Martinez is a 89 y.o. male  with a history of  gout, HLD, HTN, hypothyroidism, prostate cancer, reported colon polyps in the distant past, dysphagia in the setting of peptic stricture dilated in 2024, H. pylori in 2024, presenting today for follow-up of constipation.  EGD 12/11/22:  - Benign- appearing esophageal stenosis. Dilated. Inflamed appearing gastric mucosa of uncertain significance? status post biopsy - Normal duodenal bulb and second portion of the duodenum. Initially recommended Protonix  daily, but pathology with H pylori.  Recommended treatment with Pylera x 14 days with protonix  BID.    Barium pill esophagram 12/19/23 with esophageal dysmotility with tertiary contractions as can be seen with presbyesophagus, low volume GERD. Recommended H. pylori stool antigen, then start pantoprazole  daily.   Last seen in the office 02/04/2024.   GERD well-controlled with pantoprazole  40 mg daily.    He was only having issues with coughing when eating peanuts as he felt they would get stuck in his esophagus.  Denied any issues with fruits, vegetables, meats.  Niece who is with patient at his office visit reported patient coughing when drinking liquids and requested modified barium swallow.  Suspected dysphagia was multifactorial in the setting of esophageal dysmotility and possible oropharyngeal dysphagia.  He was referred for modified barium swallow.  Regarding his bowel habits, he was dealing with constipation.  He was skipping 2 to 3 days between bowel movements, having hard stools.  Mild abdominal pain related to constipation that would resolve after a bowel movement.  Reported colonoscopy years ago in [place].  Recommended starting MiraLAX  daily.  Regarding his history of H. pylori, plan for H. pylori stool test.  H. pylori  stool test was negative.  He had evaluation by SLP with modified barium swallow on 02/16/2024.  He was found to have mild oropharyngeal phase dysphagia.  He had 3 episodes of flash penetration without aspiration.  He was advised to consume meat/nuts by adding moisture, probably chopping, adding nuts to pudding or yogurt, taking small bites and swallowing 2-3 times for each presentation.  Otherwise, recommended regular textures.  No restrictions on liquids.   Today:     Past Medical History:  Diagnosis Date   Gout    Hyperlipemia    Hypertension    Prostate cancer (HCC)    Thyroid  disease     Past Surgical History:  Procedure Laterality Date   BIOPSY  12/11/2022   Procedure: BIOPSY;  Surgeon: Shaaron Lamar HERO, MD;  [facility];  Service: Endoscopy;;   COLONOSCOPY     ESOPHAGOGASTRODUODENOSCOPY     ESOPHAGOGASTRODUODENOSCOPY (EGD) WITH PROPOFOL  N/A 12/11/2022   Procedure: ESOPHAGOGASTRODUODENOSCOPY (EGD) WITH PROPOFOL ;  Surgeon: Shaaron Lamar HERO, MD;  [facility];  Service: Endoscopy;  Laterality: N/A;  12:15 pm, asa 3   INGUINAL HERNIA REPAIR Right 12/23/2022   Procedure: HERNIA REPAIR INGUINAL ADULT W/ MESH;  Surgeon: Kallie Manuelita BROCKS, MD;  Location: [facility];  Service: General;  Laterality: Right;  spoke with pt's niece, he is not a morning person but will do their best to be there by 7:30 - 7:45   MALONEY DILATION N/A 12/11/2022   Procedure: AGAPITO DILATION;  Surgeon: Shaaron Lamar HERO, MD;  [facility];  Service: Endoscopy;  Laterality: N/A;    Current Outpatient Medications  Medication  Sig Dispense Refill   atorvastatin (LIPITOR) 10 MG tablet Take 10 mg by mouth daily.     Boswellia-Glucosamine-Vit D (OSTEO BI-FLEX ONE PER DAY PO) Take 1 tablet by mouth daily.     Cholecalciferol (VITAMIN D3) 125 MCG (5000 UT) CAPS Take 1 capsule by mouth daily.     colchicine 0.6 MG tablet Take 0.6 mg by mouth daily as needed (gout flare-ups).     diclofenac  Sodium  (VOLTAREN ) 1 % GEL Apply 2 g topically 4 (four) times daily.     Leuprolide  Acetate, 3 Month, (ELIGARD ) 22.5 MG injection Inject 22.5 mg into the skin every 6 (six) months.     levothyroxine (SYNTHROID) 50 MCG tablet Take 50 mcg by mouth daily.     Multiple Vitamins-Minerals (CENTRUM ADULT 50+ MULTIGUMMIES) CHEW Chew by mouth.     OVER THE COUNTER MEDICATION Blue emu     pantoprazole  (PROTONIX ) 40 MG tablet Take 1 tablet by mouth once daily 30 tablet 3   pravastatin (PRAVACHOL) 20 MG tablet Take 20 mg by mouth daily.     tamsulosin (FLOMAX) 0.4 MG CAPS capsule Take 0.4 mg by mouth daily. (Patient not taking: Reported on 02/04/2024)     No current facility-administered medications for this visit.    Allergies as of 05/05/2024   (No Known Allergies)    No family history on file.  Social History   Socioeconomic History   Marital status: Single    Spouse name: Not on file   Number of children: Not on file   Years of education: Not on file   Highest education level: Not on file  Occupational History   Not on file  Tobacco Use   Smoking status: Never   Smokeless tobacco: Never  Vaping Use   Vaping status: Never Used  Substance and Sexual Activity   Alcohol  use: No   Drug use: Never   Sexual activity: Not on file  Other Topics Concern   Not on file  Social History Narrative   Not on file   Social Drivers of Health   Financial Resource Strain: Low Risk  (10/08/2022)   Received from [facility]   Overall Financial Resource Strain (CARDIA)    Difficulty of Paying Living Expenses: Not hard at all  Food Insecurity: No Food Insecurity (10/08/2022)   Received from [facility]   Hunger Vital Sign    Within the past 12 months, you worried that your food would run out before you got the money to buy more.: Never true    Within the past 12 months, the food you bought just didn't last and you didn't have money to get more.: Never true  Transportation Needs: No Transportation  Needs (10/08/2022)   Received from [facility] - Transportation    Lack of Transportation (Medical): No    Lack of Transportation (Non-Medical): No  Physical Activity: Inactive (10/08/2022)   Received from [facility]   Exercise Vital Sign    On average, how many days per week do you engage in moderate to strenuous exercise (like a brisk walk)?: 0 days    On average, how many minutes do you engage in exercise at this level?: 0 min  Stress: No Stress Concern Present (10/08/2022)   Received from [facility] - Occupational Stress Questionnaire    Feeling of Stress : Not at all  Social Connections: Socially Isolated (10/08/2022)   Received from [facility]   Social  Connection and Isolation Panel    In a typical week, how many times do you talk on the phone with family, friends, or neighbors?: More than three times a week    How often do you get together with friends or relatives?: More than three times a week    How often do you attend church or religious services?: Never    Do you belong to any clubs or organizations such as church groups, unions, fraternal or athletic groups, or school groups?: No    How often do you attend meetings of the clubs or organizations you belong to?: Never    Are you married, widowed, divorced, separated, never married, or living with a partner?: Never married    Review of Systems: Gen: Denies fever, chills, anorexia. Denies fatigue, weakness, weight loss.  CV: Denies chest pain, palpitations, syncope, peripheral edema, and claudication. Resp: Denies dyspnea at rest, cough, wheezing, coughing up blood, and pleurisy. GI: Denies vomiting blood, jaundice, and fecal incontinence.   Denies dysphagia or odynophagia. Derm: Denies rash, itching, dry skin Psych: Denies depression, anxiety, memory loss, confusion. No homicidal or suicidal ideation.  Heme: Denies bruising, bleeding, and enlarged lymph  nodes.  Physical Exam: There were no vitals taken for this visit. General:   Alert and oriented. No distress noted. Pleasant and cooperative.  Head:  Normocephalic and atraumatic. Eyes:  Conjuctiva clear without scleral icterus. Heart:  S1, S2 present without murmurs appreciated. Lungs:  Clear to auscultation bilaterally. No wheezes, rales, or rhonchi. No distress.  Abdomen:  +BS, soft, non-tender and non-distended. No rebound or guarding. No HSM or masses noted. Msk:  Symmetrical without gross deformities. Normal posture. Extremities:  Without edema. Neurologic:  Alert and  oriented x4 Psych:  Normal mood and affect.    Assessment:     Plan:  ***   Josette Centers, PA-C [facility] 05/05/2024

## 2024-05-05 ENCOUNTER — Encounter: Payer: Self-pay | Admitting: Gastroenterology

## 2024-05-05 ENCOUNTER — Ambulatory Visit (INDEPENDENT_AMBULATORY_CARE_PROVIDER_SITE_OTHER): Admitting: Gastroenterology

## 2024-05-05 VITALS — BP 138/80 | HR 67 | Temp 97.9°F | Ht 68.0 in | Wt 183.0 lb

## 2024-05-05 DIAGNOSIS — K5900 Constipation, unspecified: Secondary | ICD-10-CM

## 2024-05-05 DIAGNOSIS — K5909 Other constipation: Secondary | ICD-10-CM | POA: Diagnosis not present

## 2024-05-05 NOTE — Patient Instructions (Signed)
 Start benefiber daily.  You can follow the instructions on the container.  I recommend starting at a low dose for 2 weeks, then increasing to the max dose.  After 4 weeks, if no improvement in bowel regularity, add Colace (docusate sodium) 100 mg daily.  This can be increased to a total of 300 mg daily if needed.  If you are needing 300 mg daily, I would recommend splitting the dose with 100 mg in the morning and 200 mg in the evening or vice versa.  Follow-up in 3 months or sooner if needed.  Josette Centers, PA-C [facility]

## 2024-05-11 ENCOUNTER — Other Ambulatory Visit (HOSPITAL_COMMUNITY): Payer: Self-pay | Admitting: Urology

## 2024-05-11 DIAGNOSIS — R9721 Rising PSA following treatment for malignant neoplasm of prostate: Secondary | ICD-10-CM

## 2024-05-11 DIAGNOSIS — N182 Chronic kidney disease, stage 2 (mild): Secondary | ICD-10-CM | POA: Diagnosis not present

## 2024-05-11 DIAGNOSIS — Z13220 Encounter for screening for lipoid disorders: Secondary | ICD-10-CM | POA: Diagnosis not present

## 2024-05-11 DIAGNOSIS — R739 Hyperglycemia, unspecified: Secondary | ICD-10-CM | POA: Diagnosis not present

## 2024-05-16 ENCOUNTER — Other Ambulatory Visit: Payer: Self-pay | Admitting: Gastroenterology

## 2024-05-18 DIAGNOSIS — K222 Esophageal obstruction: Secondary | ICD-10-CM | POA: Diagnosis not present

## 2024-05-18 DIAGNOSIS — Z6826 Body mass index (BMI) 26.0-26.9, adult: Secondary | ICD-10-CM | POA: Diagnosis not present

## 2024-05-18 DIAGNOSIS — E039 Hypothyroidism, unspecified: Secondary | ICD-10-CM | POA: Diagnosis not present

## 2024-05-18 DIAGNOSIS — I10 Essential (primary) hypertension: Secondary | ICD-10-CM | POA: Diagnosis not present

## 2024-05-18 DIAGNOSIS — N1831 Chronic kidney disease, stage 3a: Secondary | ICD-10-CM | POA: Diagnosis not present

## 2024-05-19 DIAGNOSIS — K080 Exfoliation of teeth due to systemic causes: Secondary | ICD-10-CM | POA: Diagnosis not present

## 2024-05-24 ENCOUNTER — Ambulatory Visit (HOSPITAL_COMMUNITY)
Admission: RE | Admit: 2024-05-24 | Discharge: 2024-05-24 | Disposition: A | Discharge status: Home / Self Care | Source: Ambulatory Visit | Attending: Urology | Admitting: Urology

## 2024-05-24 DIAGNOSIS — M85831 Other specified disorders of bone density and structure, right forearm: Secondary | ICD-10-CM | POA: Diagnosis not present

## 2024-05-24 DIAGNOSIS — R9721 Rising PSA following treatment for malignant neoplasm of prostate: Secondary | ICD-10-CM | POA: Diagnosis not present

## 2024-05-24 DIAGNOSIS — Z13820 Encounter for screening for osteoporosis: Secondary | ICD-10-CM | POA: Insufficient documentation

## 2024-05-24 DIAGNOSIS — M85832 Other specified disorders of bone density and structure, left forearm: Secondary | ICD-10-CM | POA: Diagnosis not present

## 2024-05-24 DIAGNOSIS — M8589 Other specified disorders of bone density and structure, multiple sites: Secondary | ICD-10-CM | POA: Diagnosis not present

## 2024-05-24 DIAGNOSIS — Z8546 Personal history of malignant neoplasm of prostate: Secondary | ICD-10-CM | POA: Diagnosis not present

## 2024-06-23 DIAGNOSIS — I739 Peripheral vascular disease, unspecified: Secondary | ICD-10-CM | POA: Diagnosis not present

## 2024-06-23 DIAGNOSIS — M79671 Pain in right foot: Secondary | ICD-10-CM | POA: Diagnosis not present

## 2024-06-23 DIAGNOSIS — L609 Nail disorder, unspecified: Secondary | ICD-10-CM | POA: Diagnosis not present

## 2024-06-23 DIAGNOSIS — M79672 Pain in left foot: Secondary | ICD-10-CM | POA: Diagnosis not present

## 2024-07-06 DIAGNOSIS — R9721 Rising PSA following treatment for malignant neoplasm of prostate: Secondary | ICD-10-CM | POA: Diagnosis not present

## 2024-07-08 DIAGNOSIS — Z23 Encounter for immunization: Secondary | ICD-10-CM | POA: Diagnosis not present

## 2024-07-13 DIAGNOSIS — R3912 Poor urinary stream: Secondary | ICD-10-CM | POA: Diagnosis not present

## 2024-07-13 DIAGNOSIS — R9721 Rising PSA following treatment for malignant neoplasm of prostate: Secondary | ICD-10-CM | POA: Diagnosis not present

## 2024-07-21 DIAGNOSIS — K080 Exfoliation of teeth due to systemic causes: Secondary | ICD-10-CM | POA: Diagnosis not present

## 2024-08-03 ENCOUNTER — Encounter: Payer: Self-pay | Admitting: Gastroenterology

## 2024-08-03 ENCOUNTER — Ambulatory Visit: Admitting: Gastroenterology

## 2024-08-03 VITALS — BP 138/84 | HR 67 | Temp 97.7°F | Ht 68.0 in | Wt 182.0 lb

## 2024-08-03 DIAGNOSIS — K5909 Other constipation: Secondary | ICD-10-CM | POA: Diagnosis not present

## 2024-08-03 DIAGNOSIS — R143 Flatulence: Secondary | ICD-10-CM

## 2024-08-03 DIAGNOSIS — R1312 Dysphagia, oropharyngeal phase: Secondary | ICD-10-CM

## 2024-08-03 DIAGNOSIS — R300 Dysuria: Secondary | ICD-10-CM | POA: Diagnosis not present

## 2024-08-03 DIAGNOSIS — R634 Abnormal weight loss: Secondary | ICD-10-CM | POA: Diagnosis not present

## 2024-08-03 DIAGNOSIS — Z6826 Body mass index (BMI) 26.0-26.9, adult: Secondary | ICD-10-CM | POA: Diagnosis not present

## 2024-08-03 DIAGNOSIS — M545 Low back pain, unspecified: Secondary | ICD-10-CM | POA: Diagnosis not present

## 2024-08-03 DIAGNOSIS — K219 Gastro-esophageal reflux disease without esophagitis: Secondary | ICD-10-CM

## 2024-08-03 DIAGNOSIS — R1310 Dysphagia, unspecified: Secondary | ICD-10-CM

## 2024-08-03 DIAGNOSIS — M549 Dorsalgia, unspecified: Secondary | ICD-10-CM

## 2024-08-03 DIAGNOSIS — R296 Repeated falls: Secondary | ICD-10-CM | POA: Diagnosis not present

## 2024-08-03 DIAGNOSIS — R2681 Unsteadiness on feet: Secondary | ICD-10-CM | POA: Diagnosis not present

## 2024-08-03 DIAGNOSIS — K5900 Constipation, unspecified: Secondary | ICD-10-CM

## 2024-08-03 NOTE — Progress Notes (Signed)
 GI Office Note    Referring Provider: Lari Elspeth BRAVO, MD Primary Care Physician:  Lari Elspeth BRAVO, MD Primary Gastroenterologist: Lamar HERO.Rourk, MD  Date:  08/03/2024  ID:  Grant Martinez, DOB 1934-07-25, MRN [id number]  Chief Complaint   Chief Complaint  Patient presents with   Follow-up    Follow up. Having back pains    History of Present Illness  Grant Martinez is a 89 y.o. male with a history of HTN, HLD, gout, hypothyroidism, prostate cancer, previously reported colon polyps, dysphagia in the setting of previous peptic stricture s/p dilation in 2024, esophageal dysmotility, and oropharyngeal dysphagia, GERD, H. pylori in 2024 with documented eradication in June 2025 presenting today with complaint of back pains?  EGD March 2024: - Benign- appearing esophageal stenosis. Dilated.  - Inflamed appearing gastric mucosa of uncertain significance? status post biopsy  - Normal duodenal bulb and second portion of the duodenum. - Path: H. pylori associated gastritis and erosions - Treated with Pylera for 14 days and advised PPI twice daily along with this.  He was advised to wait to start treatment till after his inguinal hernia surgery.  In the past had noted issues with coughing with eating peanuts folic it was stuck in his esophagus.  Had no issues with fruits, vegetables, or meats.  H. pylori stool test negative in June 2025.  Evaluated by SLP with modified barium swallow in June 2025.  Found to have mild oropharyngeal phase dysphagia.  3 episodes of flash penetration without aspiration.  Advised to consume meat/nuts by adding moisture, probably chopping, adding nuts to pudding or yogurt and taking small bites and allowing 2-3 times of swallowing for each presentation.  Otherwise recommended regular textures and no restrictions on liquids.  Last office visit 05/05/2024.  Noted that his previous reflux symptoms were well-controlled with pantoprazole  40 mg once daily.  Having  bowel movement daily but not complete, stools are often hard.  MiraLAX  caused diarrhea and had couple accidents so he stopped taking it.  Currently was not taking anything for his bowels.  Never tried fiber supplementation or stool softeners consistently.  No melena or BRBPR.  Having occasional abdominal discomfort with constipation which improves when he is able to have a good bowel movement.  He was advised to start Benefiber daily and follow the instructions in the container and start low-dose for 2 weeks and then increase to max dose.  If not having productive bowel movements after 4 weeks then add Colace 100 mg daily and can increase up to 300 mg total daily in divided doses.  Today: Discussed the use of AI scribe software for clinical note transcription with the patient, who gave verbal consent to proceed.  He reports back pain that has been bothering him, and he has had recent falls. He feels weaker after receiving some sort of shot and has had episodes of imbalance leading to falls.  He experiences irregular bowel movements. Initially, he was on a regimen of Benefiber and stool softeners, but this led to excessive urgency and diarrhea. Consequently, he reduced Benefiber to once a week and continued with daily stool softeners. Despite these adjustments, he still experiences difficulty distinguishing between gas and bowel movements, leading to infrequent bowel movements every two to three days. He has cute out the benefiber completely more recently and taking stool softener once daily.   He reports daily gas with discomfort around the umbilical region. He consumes cheese but avoids other dairy products. He experiences urgency  when needing to defecate, sometimes resulting in incontinence.  He has a history of swallowing difficulties and occasional cough after eating or drinking. States he does well with liquids but more of an issue with solids. He reports no abdominal pain other than discomfort from  gas. He reports no recent changes in appetite and no significant abdominal pain. No issues with reflux recently.   He experiences weakness and imbalance, particularly after falls.     Presents with his niece who is his caregiver.   Wt Readings from Last 6 Encounters:  08/03/24 182 lb (82.6 kg)  05/05/24 183 lb (83 kg)  02/04/24 182 lb 12.8 oz (82.9 kg)  12/12/23 180 lb (81.6 kg)  01/22/23 175 lb (79.4 kg)  12/23/22 175 lb 14.8 oz (79.8 kg)    Body mass index is 27.67 kg/m.   Current Outpatient Medications  Medication Sig Dispense Refill   atorvastatin (LIPITOR) 10 MG tablet Take 10 mg by mouth daily.     Boswellia-Glucosamine-Vit D (OSTEO BI-FLEX ONE PER DAY PO) Take 1 tablet by mouth daily.     Cholecalciferol (VITAMIN D3) 125 MCG (5000 UT) CAPS Take 1 capsule by mouth daily.     colchicine 0.6 MG tablet Take 0.6 mg by mouth daily as needed (gout flare-ups).     diclofenac  Sodium (VOLTAREN ) 1 % GEL Apply 2 g topically 4 (four) times daily.     Leuprolide  Acetate, 3 Month, (ELIGARD ) 22.5 MG injection Inject 22.5 mg into the skin every 6 (six) months.     levothyroxine (SYNTHROID) 50 MCG tablet Take 50 mcg by mouth daily.     Multiple Vitamins-Minerals (CENTRUM ADULT 50+ MULTIGUMMIES) CHEW Chew by mouth.     OVER THE COUNTER MEDICATION Blue emu     pantoprazole  (PROTONIX ) 40 MG tablet Take 1 tablet by mouth once daily 30 tablet 5   pravastatin (PRAVACHOL) 20 MG tablet Take 20 mg by mouth daily.     tamsulosin (FLOMAX) 0.4 MG CAPS capsule Take 0.4 mg by mouth daily. (Patient not taking: Reported on 05/05/2024)     No current facility-administered medications for this visit.    Past Medical History:  Diagnosis Date   Gout    Hyperlipemia    Hypertension    Prostate cancer [facility])    Thyroid  disease     Past Surgical History:  Procedure Laterality Date   BIOPSY  12/11/2022   Procedure: BIOPSY;  Surgeon: Shaaron Lamar HERO, MD;  [facility];  Service: Endoscopy;;    COLONOSCOPY     ESOPHAGOGASTRODUODENOSCOPY     ESOPHAGOGASTRODUODENOSCOPY (EGD) WITH PROPOFOL  N/A 12/11/2022   Procedure: ESOPHAGOGASTRODUODENOSCOPY (EGD) WITH PROPOFOL ;  Surgeon: Shaaron Lamar HERO, MD;  [facility];  Service: Endoscopy;  Laterality: N/A;  12:15 pm, asa 3   INGUINAL HERNIA REPAIR Right 12/23/2022   Procedure: HERNIA REPAIR INGUINAL ADULT W/ MESH;  Surgeon: Kallie Manuelita BROCKS, MD;  Location: [facility];  Service: General;  Laterality: Right;  spoke with pt's niece, he is not a morning person but will do their best to be there by 7:30 - 7:45   MALONEY DILATION N/A 12/11/2022   Procedure: AGAPITO DILATION;  Surgeon: Shaaron Lamar HERO, MD;  [facility];  Service: Endoscopy;  Laterality: N/A;    No family history on file.  Allergies as of 08/03/2024   (No Known Allergies)    Social History   Socioeconomic History   Marital status: Single    Spouse name: Not on file  Number of children: Not on file   Years of education: Not on file   Highest education level: Not on file  Occupational History   Not on file  Tobacco Use   Smoking status: Never   Smokeless tobacco: Never  Vaping Use   Vaping status: Never Used  Substance and Sexual Activity   Alcohol  use: No   Drug use: Never   Sexual activity: Not on file  Other Topics Concern   Not on file  Social History Narrative   Not on file   Social Drivers of Health   Financial Resource Strain: Low Risk  (10/08/2022)   Received from [facility]   Overall Financial Resource Strain (CARDIA)    Difficulty of Paying Living Expenses: Not hard at all  Food Insecurity: No Food Insecurity (10/08/2022)   Received from [facility]   Hunger Vital Sign    Within the past 12 months, you worried that your food would run out before you got the money to buy more.: Never true    Within the past 12 months, the food you bought just didn't last and you didn't have money to get more.: Never true  Transportation Needs:  No Transportation Needs (10/08/2022)   Received from [facility] - Transportation    Lack of Transportation (Medical): No    Lack of Transportation (Non-Medical): No  Physical Activity: Inactive (10/08/2022)   Received from [facility]   Exercise Vital Sign    On average, how many days per week do you engage in moderate to strenuous exercise (like a brisk walk)?: 0 days    On average, how many minutes do you engage in exercise at this level?: 0 min  Stress: No Stress Concern Present (10/08/2022)   Received from [facility] of [facility] - Occupational Stress Questionnaire    Feeling of Stress : Not at all  Social Connections: Socially Isolated (10/08/2022)   Received from [facility]   Social Connection and Isolation Panel    In a typical week, how many times do you talk on the phone with family, friends, or neighbors?: More than three times a week    How often do you get together with friends or relatives?: More than three times a week    How often do you attend church or religious services?: Never    Do you belong to any clubs or organizations such as church groups, unions, fraternal or athletic groups, or school groups?: No    How often do you attend meetings of the clubs or organizations you belong to?: Never    Are you married, widowed, divorced, separated, never married, or living with a partner?: Never married    Review of Systems   Gen: + weakness. Denies fever, chills, anorexia. Denies fatigue, weight loss.  CV: Denies chest pain, palpitations, syncope, peripheral edema, and claudication. Resp: Denies dyspnea at rest, cough, wheezing, coughing up blood, and pleurisy. GI: See HPI Derm: Denies rash, itching, dry skin + back pain.  Psych: Denies depression, anxiety, memory loss, confusion. No homicidal or suicidal ideation.  Heme: Denies bruising, bleeding, and enlarged lymph nodes.  Physical Exam   BP 138/84 (BP Location:  Right Arm, Patient Position: Sitting, Cuff Size: Large)   Pulse 67   Temp 97.7 F (36.5 C) (Temporal)   Ht 5' 8 (1.727 m)   Wt 182 lb (82.6 kg)   BMI 27.67 kg/m   General:   Alert and oriented. No  distress noted. Pleasant and cooperative.  Head:  Normocephalic and atraumatic. Eyes:  Conjuctiva clear without scleral icterus. Mouth:  Oral mucosa pink and moist. Good dentition. No lesions. Abdomen:  +BS, soft, non-tender and non-distended. Exam limited given sitting in chair. Hyperactive bowel sounds.  Rectal: deferred Msk:  Symmetrical without gross deformities. Normal posture. Extremities:  Without edema. Neurologic:  Alert and oriented x4 Psych:  Alert and cooperative. Normal mood and affect.  Assessment & Plan  Grant Martinez is a 89 y.o. male presenting today with no complaints other than back pain which he has seen PCP for later today.     Chronic constipation with associated flatulence Chronic constipation with irregular bowel movements every two to three days. Associated flatulence with daily discomfort around the umbilicus. Current regimen of stool softener daily is insufficient with BM every other day usually but at times usure if need to defecate vs just flatulence. Cheese consumption may contribute to constipation.  - Increased stool softener to twice daily to improve bowel regularity and reduce gas. If this increases urgency or causes more loose stools, then would recommend alternating 1 capsule every other day and 2 capsules on the other alternative days. - Consider simethicone (Gas-X) for gas relief.  Oropharyngeal dysphagia Dysphagia with difficulty swallowing and coughing, particularly after eating or drinking. Has had issues with flash penetration with multiple presentations and need for multiple swallows. MBSS without aspiration in June, but this flash penetration is likely leading to coughing. No aspiration noted, but risk of aspiration exists. - Advised multiple  swallows after eating or drinking to clear throat and prevent aspiration. - Encouraged small bites and moist foods to facilitate swallowing. - Advised avoiding rough texture and ensuring all solids are moist prior to swallowing.   Back pain following recent fall Back pain exacerbated by recent fall, likely due to muscle strain or nerve irritation. Pain with movement and coughing. Weakness and balance issues may contribute to falls. - Recommended follow up with PCP to address weakness, imbalance, and back pain.      Follow up   Follow up 3 months.     Charmaine Melia, MSN, FNP-BC, AGACNP-BC [facility]

## 2024-08-03 NOTE — Patient Instructions (Addendum)
 Continue Colace however recommend taking 2 tablets once daily to help with improved bowel regularity.  I believe if your bowels move more regularly you will have less gas.  For now you can use Gas-X (simethicone) 1-2 tablets 2-3 times a day if needed for gassiness.   To help with your swallowing I recommend consuming meat/nuts by adding moisture, probably chopping, adding nuts to pudding or yogurt and taking small bites and allowing 2-3 times of swallowing. Alternate this with sips os liquids as well.  If symptoms are becoming more frequent then I recommend follow-up with speech.  Follow up in 3 months.   It was a pleasure to see you today. I want to create trusting relationships with patients. If you receive a survey regarding your visit,  I greatly appreciate you taking time to fill this out on paper or through your MyChart. I value your feedback.  Charmaine Melia, MSN, FNP-BC, AGACNP-BC [facility]

## 2024-08-11 DIAGNOSIS — R739 Hyperglycemia, unspecified: Secondary | ICD-10-CM | POA: Diagnosis not present

## 2024-08-11 DIAGNOSIS — E039 Hypothyroidism, unspecified: Secondary | ICD-10-CM | POA: Diagnosis not present

## 2024-08-11 DIAGNOSIS — E559 Vitamin D deficiency, unspecified: Secondary | ICD-10-CM | POA: Diagnosis not present

## 2024-08-11 DIAGNOSIS — E7849 Other hyperlipidemia: Secondary | ICD-10-CM | POA: Diagnosis not present

## 2024-08-11 DIAGNOSIS — D519 Vitamin B12 deficiency anemia, unspecified: Secondary | ICD-10-CM | POA: Diagnosis not present

## 2024-08-11 DIAGNOSIS — N1831 Chronic kidney disease, stage 3a: Secondary | ICD-10-CM | POA: Diagnosis not present

## 2024-08-18 DIAGNOSIS — E559 Vitamin D deficiency, unspecified: Secondary | ICD-10-CM | POA: Diagnosis not present

## 2024-08-18 DIAGNOSIS — R296 Repeated falls: Secondary | ICD-10-CM | POA: Diagnosis not present

## 2024-08-18 DIAGNOSIS — Z6826 Body mass index (BMI) 26.0-26.9, adult: Secondary | ICD-10-CM | POA: Diagnosis not present

## 2024-08-18 DIAGNOSIS — M1712 Unilateral primary osteoarthritis, left knee: Secondary | ICD-10-CM | POA: Diagnosis not present

## 2024-08-18 DIAGNOSIS — S0003XA Contusion of scalp, initial encounter: Secondary | ICD-10-CM | POA: Diagnosis not present

## 2024-08-18 DIAGNOSIS — R2681 Unsteadiness on feet: Secondary | ICD-10-CM | POA: Diagnosis not present

## 2024-11-04 ENCOUNTER — Ambulatory Visit: Admitting: Gastroenterology
